# Patient Record
Sex: Female | Born: 1975 | Race: White | Hispanic: No | Marital: Married | Smoking: Never smoker
Health system: Southern US, Community
[De-identification: ages and names within clinical notes are randomized; demographics above are authoritative.]

## PROBLEM LIST (undated history)

## (undated) DIAGNOSIS — F329 Major depressive disorder, single episode, unspecified: Secondary | ICD-10-CM

## (undated) DIAGNOSIS — F502 Bulimia nervosa, unspecified: Secondary | ICD-10-CM

## (undated) DIAGNOSIS — R569 Unspecified convulsions: Secondary | ICD-10-CM

## (undated) DIAGNOSIS — F32A Depression, unspecified: Secondary | ICD-10-CM

## (undated) DIAGNOSIS — D649 Anemia, unspecified: Secondary | ICD-10-CM

## (undated) DIAGNOSIS — Z8541 Personal history of malignant neoplasm of cervix uteri: Secondary | ICD-10-CM

## (undated) DIAGNOSIS — B977 Papillomavirus as the cause of diseases classified elsewhere: Secondary | ICD-10-CM

## (undated) HISTORY — DX: Major depressive disorder, single episode, unspecified: F32.9

## (undated) HISTORY — PX: BREAST ENHANCEMENT SURGERY: SHX7

## (undated) HISTORY — DX: Bulimia nervosa, unspecified: F50.20

## (undated) HISTORY — DX: Depression, unspecified: F32.A

## (undated) HISTORY — DX: Bulimia nervosa: F50.2

## (undated) HISTORY — DX: Papillomavirus as the cause of diseases classified elsewhere: B97.7

## (undated) HISTORY — DX: Unspecified convulsions: R56.9

## (undated) HISTORY — DX: Personal history of malignant neoplasm of cervix uteri: Z85.41

## (undated) HISTORY — DX: Anemia, unspecified: D64.9

---

## 1999-08-22 HISTORY — PX: CERVICAL BIOPSY  W/ LOOP ELECTRODE EXCISION: SUR135

## 2009-11-23 ENCOUNTER — Encounter: Payer: Self-pay | Admitting: Family Medicine

## 2009-12-15 ENCOUNTER — Encounter: Payer: Self-pay | Admitting: Family Medicine

## 2010-08-04 ENCOUNTER — Ambulatory Visit: Payer: Self-pay | Admitting: Family Medicine

## 2010-08-04 DIAGNOSIS — F410 Panic disorder [episodic paroxysmal anxiety] without agoraphobia: Secondary | ICD-10-CM

## 2010-08-04 DIAGNOSIS — R569 Unspecified convulsions: Secondary | ICD-10-CM | POA: Insufficient documentation

## 2010-08-04 DIAGNOSIS — L989 Disorder of the skin and subcutaneous tissue, unspecified: Secondary | ICD-10-CM | POA: Insufficient documentation

## 2010-08-04 DIAGNOSIS — M722 Plantar fascial fibromatosis: Secondary | ICD-10-CM

## 2010-08-04 DIAGNOSIS — D649 Anemia, unspecified: Secondary | ICD-10-CM | POA: Insufficient documentation

## 2010-08-04 DIAGNOSIS — Z8541 Personal history of malignant neoplasm of cervix uteri: Secondary | ICD-10-CM | POA: Insufficient documentation

## 2010-08-04 DIAGNOSIS — F329 Major depressive disorder, single episode, unspecified: Secondary | ICD-10-CM

## 2010-09-22 NOTE — Letter (Signed)
Summary: Patient Questionnaire  Patient Questionnaire   Imported By: Beau Fanny 08/04/2010 15:18:11  _____________________________________________________________________  External Attachment:    Type:   Image     Comment:   External Document

## 2010-09-22 NOTE — Miscellaneous (Signed)
Summary: Vaccine Records  Vaccine Records   Imported By: Lanelle Bal 08/12/2010 11:33:10  _____________________________________________________________________  External Attachment:    Type:   Image     Comment:   External Document

## 2010-09-22 NOTE — Assessment & Plan Note (Signed)
Summary: NEW PT TO EST/CLE   Vital Signs:  Patient profile:   35 year old female Height:      67.5 inches Weight:      152.25 pounds BMI:     23.58 Temp:     98.6 degrees F oral Pulse rate:   68 / minute Pulse rhythm:   regular BP sitting:   108 / 76  (left arm) Cuff size:   regular  Vitals Entered By: Lewanda Rife LPN (August 04, 2010 11:26 AM) CC: New pt to establish   History of Present Illness: is starting PT school -- is excited about that  moved here from [place]   she does not see a gyn -- needs appt in may last few paps after the leep were nl   had bout of anxiety in the past  was on cymbalta for a while  she cut out caffine works out 6-7 days per week  occ has panic attack couple times per year -- usually last about an hour   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 is better now --- with counseling and psychiatry  no longer obcesses about her weight   has plantar fasciitis - used to wear a boot - to sleep is a runner   does stretches  one foot is worse  L is left  -- hurts lateral foot  too many flip flops   on R shoulder -- is since august -- sees Derm next week-- is firm mole - that scales up  random episodes of night sweats  could be peroid related   sleep requirements are more than her husb and that concerns her  she does a lot during the day and is generally high energy   last blood draw in a long time           Preventive Screening-Counseling & Management  Alcohol-Tobacco     Smoking Status: never  Caffeine-Diet-Exercise     Does Patient Exercise: yes      Drug Use:  no.    Allergies (verified): 1)  ! 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 Eyes:  Denies blurring and eye irritation.  CV:  Denies chest pain or discomfort, palpitations, and shortness of breath with exertion. Resp:  Denies cough, shortness of breath, and wheezing. GI:  Denies abdominal pain, change in bowel habits, indigestion, and nausea. GU:  Denies urinary frequency. MS:  Complains of joint pain, muscle aches, and stiffness; denies joint redness and joint swelling. Derm:  Denies itching, lesion(s), poor wound healing, and rash. Neuro:  Denies numbness and tingling. Psych:  Denies anxiety and depression. Endo:  Denies cold intolerance, excessive thirst, excessive urination, and heat intolerance. Heme:  Denies abnormal  bruising and bleeding.  Physical Exam  General:  Well-developed,well-nourished,in no acute distress; alert,appropriate and cooperative throughout examination Head:  normocephalic, atraumatic, and no abnormalities observed.   Eyes:  vision grossly intact, pupils equal, pupils round, and pupils reactive to light.   Ears:  R ear normal and L ear normal.   Nose:  no nasal discharge.   Mouth:  pharynx pink and moist.   Neck:  supple with full rom and no masses or thyromegally, no JVD or carotid bruit  Lungs:  Normal respiratory effort, chest expands symmetrically. Lungs are clear to auscultation, no crackles or wheezes. Heart:  Normal rate and regular rhythm. S1 and S2 normal without gallop, murmur, click, rub or other extra sounds. Msk:  some tenderness lateral L heel nl rom feel and knees no acute joint changes  Pulses:  R and L carotid,radial,femoral,dorsalis pedis and posterior tibial pulses are full and equal bilaterally Extremities:  no CCE Neurologic:  sensation intact to light touch, gait normal, and DTRs symmetrical and normal.   Skin:  scaley lesion L shoulder resembles SK Cervical Nodes:  No lymphadenopathy noted Psych:  normal affect, talkative and pleasant    Impression & Recommendations:  Problem # 1:  CERVICAL CANCER, HX OF (ICD-V10.41) Assessment New ref to gyn for ongoing care of abn paps in past  no symtoms at this time Orders: Gynecologic Referral (Gyn)  Problem # 2:  PLANTAR FASCIITIS (ICD-728.71) Assessment: New this is worse in 1 foot rev some home efforts to help-- no barefoot/ hard soled shoe/ ice  may need custom orthotics ultimately as a runner  will call back for appt with Dr Patsy Lager in future when she is ready  Problem # 3:  PANIC DISORDER (ICD-300.01) Assessment: New this is infrequent -- if worse consider ssri disc symptoms and stressors in detail  continue xanax sparingly prn Her updated medication list for this problem includes:    Xanax 0.5 Mg  Tabs (Alprazolam) .Marland Kitchen... 1 by mouth once daily as needed for panic attack or before a blood draw  Problem # 4:  SKIN LESION (ICD-709.9) Assessment: New resembles AK on R shoulder  will f/u with derm next week for tx -- likey cryotx or bx  Complete Medication List: 1)  Microgestin 1.5/30 1.5-30 Mg-mcg Tabs (Norethindrone acet-ethinyl est) .... Take 1 tablet by mouth once a day 2)  Zinc ?mg  .... Take 1 tablet by mouth once a day during winter months 3)  Multivitamins Tabs (Multiple vitamin) .... Take 1 tablet by mouth once a day 4)  Vitamin C 500 Mg Tabs (Ascorbic acid) .... Take 1 tablet by mouth once a day during winter months 5)  Echinacea ?mg  .... Take 1 tablet by mouth once a day during winter months 6)  Xanax 0.5 Mg Tabs (Alprazolam) .Marland Kitchen.. 1 by mouth once daily as needed for panic attack or before a blood draw  Patient Instructions: 1)  we will do gyn  referral at check out  2)  when you are ready to get plantar fasciitis checked out-- call to make appt with Dr Patsy Lager  3)  schedule fasting labs (take your xanax before) and then PE in may after your gyn visit  4)  follow up with derm about the mole  5)  use xanax with caution and only when absolutely necessary Prescriptions: XANAX 0.5 MG TABS (ALPRAZOLAM) 1 by mouth once daily as needed for panic attack or before a blood draw  #15 x 0   Entered and Authorized by:   Judith Part MD   Signed by:   Judith Part MD on 08/04/2010   Method used:   Print then Give to Patient   RxID:   [id number]    Orders Added: 1)  Gynecologic Referral [Gyn] 2)  New Patient Level III [99203]   Immunization History:  Tetanus/Td Immunization History:    Tetanus/Td:  historical (01/17/2006)  Influenza Immunization History:    Influenza:  historical received at [facility] (06/20/2010)   Immunization History:  Tetanus/Td Immunization History:    Tetanus/Td:  Historical (01/17/2006)  Influenza Immunization History:    Influenza:   Historical received at [facility] (06/20/2010)  Current Allergies (reviewed today): ! TUBERSOL (TUBERCULIN PPD)

## 2010-09-22 NOTE — Letter (Signed)
Summary: Copy of Medical History & Physical Exam Forms/[employer]  Copy of Medical History & Physical Exam Forms/[employer]   Imported By: Lanelle Bal 08/12/2010 11:34:20  _____________________________________________________________________  External Attachment:    Type:   Image     Comment:   External Document

## 2011-03-01 ENCOUNTER — Encounter: Payer: Self-pay | Admitting: Family Medicine

## 2011-03-01 ENCOUNTER — Ambulatory Visit (INDEPENDENT_AMBULATORY_CARE_PROVIDER_SITE_OTHER): Payer: BC Managed Care – PPO | Admitting: Family Medicine

## 2011-03-01 DIAGNOSIS — F419 Anxiety disorder, unspecified: Secondary | ICD-10-CM | POA: Insufficient documentation

## 2011-03-01 DIAGNOSIS — F411 Generalized anxiety disorder: Secondary | ICD-10-CM

## 2011-03-01 DIAGNOSIS — K120 Recurrent oral aphthae: Secondary | ICD-10-CM | POA: Insufficient documentation

## 2011-03-01 MED ORDER — SERTRALINE HCL 50 MG PO TABS: 50.0000 mg | ORAL_TABLET | Freq: Every day | ORAL | Status: DC

## 2011-03-01 NOTE — Assessment & Plan Note (Signed)
With situational stress and some panic attacks  Will try zoloft 50 mg - (start with 25) and update Disc poss side eff in detail incl dep or SI Disc lifestyle change for this and concentration  Disc stressors and exp for school  Urged to continue exercise >25 min spent with face to face with patient, >50% counseling and/or coordinating care

## 2011-03-01 NOTE — Patient Instructions (Signed)
Take 1/2 zoloft tab once daily in evening for 1 week and then increase to 1 pill each evening if well tolerated  If worse symptoms or intolerable side effects - call and let me know  Consider counseling in future if needed  Switch to baking soda and peroxide toothpaste

## 2011-03-01 NOTE — Assessment & Plan Note (Signed)
Intermittent- none today  Disc change toothpaste Stress plays a role Disc diet  If worse - consider kenalog in oragel for large lesions

## 2011-03-01 NOTE — Progress Notes (Signed)
Subjective:    Patient ID: Brenda Harrison, female    DOB: 1975-09-21, 35 y.o.   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  Had a few nights with excessive anxiety- up all night -- and then finally took a xanax  This was night before exams  More "near" panic attacks and old anx - with inc in stress Comes in waves - strongly  Given 15 xanax -- has taken 2 so far because she is worried about addiction   More trouble focusing also Moved to front of class Wears earplugs during exams  Trouble focusing during lectures   During stressful times gets sores in mouth with little ulcers  Has tried peroxide and salt water  They do get better  Also bites the inside of her mouth   School is more intense than she thought it would be -- 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 ANEMIA-NOS  . PANIC DISORDER  . DEPRESSION  . SKIN LESION  . PLANTAR FASCIITIS  . SEIZURE DISORDER  . CERVICAL CANCER, HX OF  . Anxiety  . Ulcer aphthous oral   Past Medical History  Diagnosis Date  . Anemia   . History of cervical cancer     hx of precancer cells  . Depression     temporary and mild  . Seizure     rxn to  TB skin test  . Bulimia     during her body building years  . HPV (human papilloma virus) infection    Past Surgical History  Procedure Date  . Breast enhancement surgery   . Cervical biopsy  w/ loop electrode excision 2001    to remove precancerous cells due to HPV 2001   History  Substance Use Topics  . Smoking status:  Never Smoker   . Smokeless tobacco: Not on file  . Alcohol Use: No   Family History  Problem Relation Age of Onset  . Hyperlipidemia Mother   . Hypertension Mother   . Anxiety disorder Mother   . Hyperlipidemia Father   . Hypertension Father   . Depression Father   . Colon cancer Maternal Grandmother   . Lung cancer Maternal Grandmother    Allergies  Allergen Reactions  . Tuberculin Purified Protein Derivative     REACTION: Grand mal seizures if takes TB skin test.   No current outpatient prescriptions on file prior to visit.       Review of Systems Review of Systems  Constitutional: Negative for fever, appetite change, fatigue and unexpected weight change.  Eyes: Negative for pain and visual disturbance.  Respiratory: Negative for cough and shortness of breath.   Cardiovascular: Negative.   Gastrointestinal: Negative for nausea, diarrhea and constipation.  Genitourinary: Negative for urgency and frequency.  Skin: Negative for pallor. neg for rash  Neurological: Negative for weakness, light-headedness, numbness and headaches.  Hematological: Negative for adenopathy. Does not bruise/bleed easily.  Psychiatric/Behavioral: Negative  for dysphoric mood. Pos for anx and trouble concentrating          Objective:   Physical Exam  Constitutional: She appears well-developed and well-nourished. No distress.  HENT:  Head: Normocephalic and atraumatic.  Nose: Nose normal.  Mouth/Throat: Oropharynx is clear and moist.       No mouth lesions today  Eyes: Conjunctivae and EOM are normal. Pupils are equal, round, and reactive to light.  Neck: Normal range of motion. Neck supple. No JVD present. No thyromegaly present.  Cardiovascular: Normal rate, regular rhythm and normal heart sounds.   Pulmonary/Chest: Effort normal and breath sounds normal. No respiratory distress. She has no wheezes. She has no rales.  Abdominal: Soft. Bowel sounds are normal. There is no tenderness.    Musculoskeletal: She exhibits no edema and no tenderness.  Lymphadenopathy:    She has no cervical adenopathy.  Neurological: She is alert. She has normal reflexes. No cranial nerve deficit. Coordination normal.       No tremor   Skin: Skin is warm and dry. No rash noted. No erythema. No pallor.  Psychiatric:       Mildly anxious  Speech is somewhat rapid Good eye contact and comm skills  Good insight Is attentive          Assessment & Plan:

## 2011-03-02 ENCOUNTER — Telehealth: Payer: Self-pay | Admitting: *Deleted

## 2011-03-02 MED ORDER — ESZOPICLONE 3 MG PO TABS: 3.0000 mg | ORAL_TABLET | Freq: Every evening | ORAL | Status: DC | PRN

## 2011-03-02 NOTE — Telephone Encounter (Signed)
Go ahead and try it in the am  If this does not help I will px lunesta  Let me know how it goes

## 2011-03-02 NOTE — Telephone Encounter (Signed)
Patient says that she woke up at 3:00 am and was never able to go back to sleep. She says that she knows it is the Zoloft because when she would take these types of meds in the past it would cause her to have trouble sleeping. She says that in the past her previous Dr. Eulah Citizen prescribe lunesta. She is asking if you could call this in for her or if you would rather her stop taking the zoloft. Please advise. Uses [facility] on [address]

## 2011-03-02 NOTE — Telephone Encounter (Signed)
Patient called back and spoke with Jacki Cones. She asked if it is okay that she take the zoloft in the am. She was advised that it would be okay.

## 2011-03-02 NOTE — Telephone Encounter (Signed)
That is fine 

## 2011-03-02 NOTE — Telephone Encounter (Signed)
Patient notified as instructed by telephone. Pt said she is stressing so much about not sleeping and she has tried the OTC sleep aids and they are not helping. Pt said she thinks she needs to get prescription sleep aid and to verify if you want her to continue the Zoloft. Pt uses [facility] on [address]. [employer] if pharmacy needed.

## 2011-03-02 NOTE — Telephone Encounter (Signed)
Ok --Px written for call in  For [place]

## 2011-03-02 NOTE — Telephone Encounter (Signed)
Now prior Brenda Harrison is needed for lunesta, form from [employer] is on your shelf.

## 2011-03-02 NOTE — Telephone Encounter (Signed)
Patient notified as instructed by telephone. Medication phoned to [facility] as instructed. Pt said since she spoke with me she had called back and spoke with Jacki Cones. Pt is going to try taking Zoloft 1/4 pill daily for 1 wk, then 1/2pill daily for 1 wk, then 3/4 pill daily for one week and then work up to 1 pill daily of Zoloft. Pt just wanted Dr Milinda Antis to be aware. Thank you.

## 2011-03-03 NOTE — Telephone Encounter (Signed)
Completed form faxed to [phone] as instructed. Patient notified as instructed by telephone. Weston Brass at [facility] said pt could purchase 3 pills until hears from insurance co on prior auth. Copy of form given to [facility] and sent to be scanned.

## 2011-03-03 NOTE — Telephone Encounter (Signed)
Patient notified as instructed by telephone. Pt said the only med she has taken for sleep is Lunesta. Pt said she would call today to get preferred list and call back with info.

## 2011-03-03 NOTE — Telephone Encounter (Signed)
Prior auth given for [employer], pt and pharmacy advised.  Approval letter given to doctor for signature and scanning.

## 2011-03-03 NOTE — Telephone Encounter (Signed)
Pt states her insurance company told her that Alfonso Patten will be covered at a higher tier if we send the prior auth form back in.  She would like this sent back in today if possible.

## 2011-03-03 NOTE — Telephone Encounter (Signed)
Ok- form is in IN box - and I noted on it she wants to stay on lunesta  thanks

## 2011-03-03 NOTE — Telephone Encounter (Signed)
Pt called back again, said the problem is a quantity limit of 20 in a 30 day period.  I told her the form will say that she needs to take this every night.

## 2011-03-03 NOTE — Telephone Encounter (Signed)
Her prior Berkley Harvey does not give me a list of preferred drugs for sleep- just what it does not cover She will need to call her ins co to see what pref drugs are and then tell me which of those she has tried and failed Otherwise will need to pay out of pocket for lunesta Let me know what she wants to do and I will hold the papers on my desk-thanks

## 2011-03-06 ENCOUNTER — Ambulatory Visit: Payer: Self-pay | Admitting: Family Medicine

## 2011-05-03 ENCOUNTER — Encounter: Payer: Self-pay | Admitting: Family Medicine

## 2011-05-03 ENCOUNTER — Ambulatory Visit (INDEPENDENT_AMBULATORY_CARE_PROVIDER_SITE_OTHER): Payer: BC Managed Care – PPO | Admitting: Family Medicine

## 2011-05-03 VITALS — BP 124/76 | HR 60 | Temp 99.1°F | Ht 67.5 in | Wt 153.0 lb

## 2011-05-03 DIAGNOSIS — F419 Anxiety disorder, unspecified: Secondary | ICD-10-CM

## 2011-05-03 DIAGNOSIS — Z23 Encounter for immunization: Secondary | ICD-10-CM

## 2011-05-03 DIAGNOSIS — F411 Generalized anxiety disorder: Secondary | ICD-10-CM

## 2011-05-03 DIAGNOSIS — Z0000 Encounter for general adult medical examination without abnormal findings: Secondary | ICD-10-CM | POA: Insufficient documentation

## 2011-05-03 DIAGNOSIS — Z8541 Personal history of malignant neoplasm of cervix uteri: Secondary | ICD-10-CM

## 2011-05-03 DIAGNOSIS — Z111 Encounter for screening for respiratory tuberculosis: Secondary | ICD-10-CM | POA: Insufficient documentation

## 2011-05-03 NOTE — Assessment & Plan Note (Addendum)
Reviewed health habits including diet and exercise and skin cancer prevention Also reviewed health mt list, fam hx and immunizations   Will schedule wellness labs when she can return for them  She may also need a drug screen done for school  Flu shot and Tdap today

## 2011-05-03 NOTE — Patient Instructions (Signed)
Schedule upcoming labs (not fasting but eat light)  And chest x ray when you check out  Tdap vaccine and flu shot today  Ask Dr Brenda Harrison about your night sweats and lack or menses and baseline mammogram  I will update you about results when they return

## 2011-05-03 NOTE — Assessment & Plan Note (Signed)
Doing very well with zoloft and rare xanax  Will continue these  Did cut zoloft to 25 mg Enc continuing exercise

## 2011-05-03 NOTE — Assessment & Plan Note (Signed)
utd on pap with hpv - was neg in July  Enc pt to disc nt sweats and amenorrhea with Dr Vincente Poli

## 2011-05-03 NOTE — Assessment & Plan Note (Signed)
Pt allergic to PPD test so will need cxr for school when Camelia Eng is back No symptoms  Will schedule that

## 2011-05-03 NOTE — Progress Notes (Signed)
Subjective:    Patient ID: Brenda Harrison, female    DOB: 27-Jan-1976, 35 y.o.   MRN: [id number]  HPI Here for annual wellness exam and to review chronic med problems  Is hanging in there  School is very intense and going fast  Is doing ok overall - so far so good  Still has 2 years left for school  Cannot do labs - did not take her xanax today (needs that for anx of blood draw)  Wants to get a flu shot  Cannot get a PPD due to allergy  So needs that and then a letter on letter head    Feeling ok overall  Some sneezing - could be allergies  Clear mucous  hayfever is starting - 1-2 weeks   Wt is stable with bmi of 23  Pap - by gyn-- with nl pap smear  Hx of cervical pre cancerous cells and HPV On microgestin peroids are seldom on this pill - some spotting  Still has night sweats  ? Early menopause -- takes ca and vit D and wt lifting   Td 07- is interested in Tdap for pertussis protection     Anxiety / depression  Is doing well - taking 1/2 of the 50 mg  Likes it - no lack of motivation and anxiety is well controlled   No new family history  May need a drug test for work but does not have the tests with her or know if she can do them here   Patient Active Problem List  Diagnoses  . ANEMIA-NOS  . PANIC DISORDER  . DEPRESSION  . SKIN LESION  . PLANTAR FASCIITIS  . SEIZURE DISORDER  . CERVICAL CANCER, HX OF  . Anxiety  . Ulcer aphthous oral  . Routine general medical examination at a health care facility  . Screening-pulmonary TB   Past Medical History  Diagnosis Date  . Anemia   . History of cervical cancer     hx of precancer cells  . Depression     temporary and mild  . Seizure     rxn to  TB skin test  . Bulimia     during her body building years  . HPV (human papilloma virus) infection    Past Surgical History  Procedure Date  . Breast enhancement surgery   . Cervical biopsy  w/ loop electrode excision 2001    to remove precancerous cells due  to HPV 2001   History  Substance Use Topics  . Smoking status: Never Smoker   . Smokeless tobacco: Not on file  . Alcohol Use: No   Family History  Problem Relation Age of Onset  . Hyperlipidemia Mother   . Hypertension Mother   . Anxiety disorder Mother   . Hyperlipidemia Father   . Hypertension Father   . Depression Father   . Colon cancer Maternal Grandmother   . Lung cancer Maternal Grandmother    Allergies  Allergen Reactions  . Tuberculin Purified Protein Derivative     REACTION: Grand mal seizures if takes TB skin test.   Current Outpatient Prescriptions on File Prior to Visit  Medication Sig Dispense Refill  . ALPRAZolam (XANAX) 0.5 MG tablet Take 0.5 mg by mouth. Once daily or as needed for panic attacks or before blood draw      . calcium carbonate 200 MG capsule Take by mouth daily.        Marland Kitchen ECHINACEA HERB PO Take 1 tablet by mouth daily.  During the winter       . Ginkgo Biloba 40 MG TABS Take 1 tablet by mouth daily.        . Multiple Vitamin (MULTIVITAMIN) tablet Take 1 tablet by mouth daily.        . Multiple Vitamins-Minerals (ZINC PO) Take 1 tablet by mouth daily.        . norethindrone-ethinyl estradiol-iron (MICROGESTIN FE1.5/30) 1.5-30 MG-MCG tablet Take 1 tablet by mouth daily.        . sertraline (ZOLOFT) 50 MG tablet Take 1 tablet (50 mg total) by mouth daily.  30 tablet  11  . vitamin B-12 (CYANOCOBALAMIN) 50 MCG tablet Take 50 mcg by mouth daily.        . vitamin C (ASCORBIC ACID) 500 MG tablet Take 500 mg by mouth daily. During winter       . Eszopiclone (ESZOPICLONE) 3 MG TABS Take 1 tablet (3 mg total) by mouth at bedtime as needed. Take immediately before bedtime  30 tablet  3      Review of Systems Review of Systems  Constitutional: Negative for fever, appetite change, fatigue and unexpected weight change.  Eyes: Negative for pain and visual disturbance.  Respiratory: Negative for cough and shortness of breath.   Cardiovascular: Negative for  cp or palpitations    Gastrointestinal: Negative for nausea, diarrhea and constipation.  Genitourinary: Negative for urgency and frequency.  Skin: Negative for pallor or rash   Neurological: Negative for weakness, light-headedness, numbness and headaches.  Hematological: Negative for adenopathy. Does not bruise/bleed easily.  Psychiatric/Behavioral: Negative for dysphoric mood. Anxiety is improved         Objective:   Physical Exam  Constitutional: She appears well-developed and well-nourished. No distress.  HENT:  Head: Normocephalic and atraumatic.  Right Ear: External ear normal.  Left Ear: External ear normal.  Nose: Nose normal.  Mouth/Throat: Oropharynx is clear and moist.  Eyes: Conjunctivae and EOM are normal. Pupils are equal, round, and reactive to light.  Neck: Normal range of motion. Neck supple. No JVD present. Carotid bruit is not present. No thyromegaly present.  Cardiovascular: Normal rate, regular rhythm, normal heart sounds and intact distal pulses.   Pulmonary/Chest: Effort normal and breath sounds normal. No respiratory distress. She has no wheezes.  Abdominal: Soft. Bowel sounds are normal. She exhibits no distension and no mass. There is no tenderness.  Musculoskeletal: Normal range of motion. She exhibits no edema and no tenderness.  Lymphadenopathy:    She has no cervical adenopathy.  Neurological: She is alert. She has normal reflexes. No cranial nerve deficit. Coordination normal.  Skin: Skin is warm and dry. No rash noted. No erythema. No pallor.  Psychiatric: She has a normal mood and affect.       Cheerful and talkative           Assessment & Plan:

## 2011-05-15 ENCOUNTER — Telehealth: Payer: Self-pay | Admitting: *Deleted

## 2011-05-15 NOTE — Telephone Encounter (Signed)
Pt needs a letter stating that she has been prescribed xanax.  She needs this for a drug screen that she will be having, needs on office letter head.  Please call when ready.

## 2011-05-16 ENCOUNTER — Telehealth: Payer: Self-pay

## 2011-05-16 DIAGNOSIS — Z111 Encounter for screening for respiratory tuberculosis: Secondary | ICD-10-CM

## 2011-05-16 DIAGNOSIS — Z021 Encounter for pre-employment examination: Secondary | ICD-10-CM | POA: Insufficient documentation

## 2011-05-16 NOTE — Telephone Encounter (Signed)
I will write letter and put it in IN box.

## 2011-05-16 NOTE — Telephone Encounter (Signed)
Pt needs written lab order for 2 point urinalysis drug test. Pt also needs letter stating she is allergic to PPD and that is why she has a chest x ray and also include in the letter that pt is prescribed Xanax which can be found in the drug screening test. Pt will pick up order and letter when has lab and chest xray done here at [facility] lab on 05/17/11.

## 2011-05-16 NOTE — Telephone Encounter (Signed)
Patient notified as instructed by telephone v/m as instructed by pt.Letter is at front desk for pick up.

## 2011-05-16 NOTE — Telephone Encounter (Signed)
Sorry for the confusion. Rodney Booze said we do not do drug screens at the lab here so that is why pt needs written order for the 12 point urinalysis drug test ( unable to reach pt by phone but Rodney Booze said pt could get at one of the [facility] stations). The chest x ray does need to be ordered here but pt said she needs a letter stating the reason she is getting a chest x ray is because she is allergic to tb skin test.(not sure if this is for the school or pt's insurance.)

## 2011-05-16 NOTE — Telephone Encounter (Signed)
Could you clarify-  She needs order on px pad for the lab (we are not doing here) - and ask where she is having it  I need to order the cxr here- correct? Thanks - then I will proceed

## 2011-05-16 NOTE — Telephone Encounter (Signed)
I will put px in the IN box According to the chart I have already ordered the cxr -- just need to schedule it please  I will do letter and put that in the IN box too

## 2011-05-16 NOTE — Telephone Encounter (Signed)
Patient notified as instructed by telephone v/m as instructed by pt.Pt said she already has appt for lab and xray 05/17/11.Letter and lab order is at front desk for pick up.

## 2011-05-17 ENCOUNTER — Other Ambulatory Visit (INDEPENDENT_AMBULATORY_CARE_PROVIDER_SITE_OTHER): Payer: BC Managed Care – PPO

## 2011-05-17 DIAGNOSIS — Z0000 Encounter for general adult medical examination without abnormal findings: Secondary | ICD-10-CM

## 2011-05-17 LAB — COMPREHENSIVE METABOLIC PANEL
AST: 18 U/L (ref 0–37)
Albumin: 3.5 g/dL (ref 3.5–5.2)
BUN: 10 mg/dL (ref 6–23)
CO2: 25 mEq/L (ref 19–32)
Calcium: 8.9 mg/dL (ref 8.4–10.5)
Chloride: 104 mEq/L (ref 96–112)
Creatinine, Ser: 0.8 mg/dL (ref 0.4–1.2)
GFR: 91.57 mL/min (ref >60.00)
Glucose, Bld: 129 mg/dL — ABNORMAL HIGH (ref 70–99)
Potassium: 4.4 mEq/L (ref 3.5–5.1)

## 2011-05-17 LAB — CBC WITH DIFFERENTIAL/PLATELET
Basophils Relative: 0.4 % (ref 0.0–3.0)
Eosinophils Absolute: 0.2 10*3/uL (ref 0.0–0.7)
Eosinophils Relative: 2.9 % (ref 0.0–5.0)
Hemoglobin: 13.8 g/dL (ref 12.0–15.0)
Lymphocytes Relative: 35.8 % (ref 12.0–46.0)
MCHC: 33.6 g/dL (ref 30.0–36.0)
Monocytes Relative: 6.7 % (ref 3.0–12.0)
Neutro Abs: 3.2 10*3/uL (ref 1.4–7.7)
Neutrophils Relative %: 54.2 % (ref 43.0–77.0)
RBC: 4.2 Mil/uL (ref 3.87–5.11)
WBC: 5.9 10*3/uL (ref 4.5–10.5)

## 2011-05-17 LAB — LIPID PANEL
Cholesterol: 186 mg/dL (ref 0–200)
HDL: 55.9 mg/dL (ref >39.00)
Total CHOL/HDL Ratio: 3
Triglycerides: 38 mg/dL (ref 0.0–149.0)

## 2011-05-24 ENCOUNTER — Ambulatory Visit (INDEPENDENT_AMBULATORY_CARE_PROVIDER_SITE_OTHER)
Admission: RE | Admit: 2011-05-24 | Discharge: 2011-05-24 | Disposition: A | Discharge status: Home / Self Care | Payer: BC Managed Care – PPO | Source: Ambulatory Visit | Attending: Family Medicine | Admitting: Family Medicine

## 2011-05-24 DIAGNOSIS — Z111 Encounter for screening for respiratory tuberculosis: Secondary | ICD-10-CM

## 2011-05-26 ENCOUNTER — Telehealth: Payer: Self-pay | Admitting: *Deleted

## 2011-05-26 NOTE — Telephone Encounter (Signed)
I will work on it to have it done then

## 2011-05-26 NOTE — Telephone Encounter (Signed)
Pt states she dropped off a physical form for completion and she is asking if ready for pick up.  She's coming in on Tuesday to pick up a copy of her CXR report and wants to pick the form up then.

## 2011-05-26 NOTE — Telephone Encounter (Signed)
Patient notified as instructed by telephone. Pt will come by Fri 06/02/11 for vision screening test and will pick up form at that time.

## 2011-05-26 NOTE — Telephone Encounter (Signed)
Pt got off early today and came by and picked up xray report and asked that physical exam report be mailed to her home when Dr Milinda Antis completes it.

## 2011-06-06 NOTE — Telephone Encounter (Signed)
Pt came by [place] and someone did vision test (I was not in office) and copy of form sent to be scanned and pt got original form.

## 2011-07-21 ENCOUNTER — Other Ambulatory Visit: Payer: Self-pay | Admitting: Family Medicine

## 2011-07-21 NOTE — Telephone Encounter (Signed)
[facility] request refill alprazolam 0.5 mg . Last refilled 08/26/10 and pt last seen 05/03/11.Please advise.

## 2011-07-23 NOTE — Telephone Encounter (Signed)
Px written for call in   

## 2011-07-24 ENCOUNTER — Other Ambulatory Visit: Payer: Self-pay | Admitting: Family Medicine

## 2011-07-24 NOTE — Telephone Encounter (Signed)
Medication phoned to [facility] pharmacy as instructed.

## 2011-07-25 NOTE — Telephone Encounter (Signed)
?   Just did on 11/30?

## 2011-09-06 ENCOUNTER — Telehealth: Payer: Self-pay | Admitting: Internal Medicine

## 2011-09-06 DIAGNOSIS — F909 Attention-deficit hyperactivity disorder, unspecified type: Secondary | ICD-10-CM | POA: Insufficient documentation

## 2011-09-06 MED ORDER — AMPHETAMINE-DEXTROAMPHETAMINE 20 MG PO TABS: ORAL_TABLET | ORAL | Status: DC

## 2011-09-06 NOTE — Telephone Encounter (Signed)
Patient faxed notes from her psychologist dx. With ADHD and was hoping to get medication for this.  Please advise.

## 2011-09-06 NOTE — Telephone Encounter (Signed)
Patient notified as instructed by telephone.Prescription left at front desk. Appt scheduled with Dr Milinda Antis 10/17/11 at 11:15am.

## 2011-09-06 NOTE — Telephone Encounter (Signed)
I reviewed those records and am in agreement that she needs to get started on a stimulant- my first choice is adderall 20 bid  Someone has to pick px up for her - cannot be mailed from here - I know she is out of town on rotation and cannot come in until feb If side eff-nervousness/ dec appetite/ headache/ stomach ache- stop it and call  It will be a process px and adj med to suit her  Let her know how it works here in terms of monthly written px and pick up Px printed for pick up in IN box F/u in feb when she gets back please

## 2011-10-03 ENCOUNTER — Other Ambulatory Visit: Payer: Self-pay

## 2011-10-03 NOTE — Telephone Encounter (Signed)
Pt called requested written rx for adderall. Please call pt at [phone] when rx is ready.

## 2011-10-04 ENCOUNTER — Telehealth: Payer: Self-pay | Admitting: Family Medicine

## 2011-10-04 MED ORDER — AMPHETAMINE-DEXTROAMPHETAMINE 20 MG PO TABS: ORAL_TABLET | ORAL | Status: DC

## 2011-10-04 NOTE — Telephone Encounter (Signed)
Left message on cell phone voicemail, Rx is ready for pick up will be left at front desk. 

## 2011-10-04 NOTE — Telephone Encounter (Signed)
Pt request Adderall 20mg ... Call Back # [phone]

## 2011-10-04 NOTE — Telephone Encounter (Signed)
Px printed for pick up in IN box  

## 2011-10-04 NOTE — Telephone Encounter (Signed)
I did this already.

## 2011-10-17 ENCOUNTER — Ambulatory Visit (INDEPENDENT_AMBULATORY_CARE_PROVIDER_SITE_OTHER): Payer: BC Managed Care – PPO | Admitting: Family Medicine

## 2011-10-17 ENCOUNTER — Encounter: Payer: Self-pay | Admitting: Family Medicine

## 2011-10-17 VITALS — BP 114/70 | HR 72 | Temp 98.0°F | Ht 67.5 in | Wt 148.0 lb

## 2011-10-17 DIAGNOSIS — R7309 Other abnormal glucose: Secondary | ICD-10-CM

## 2011-10-17 DIAGNOSIS — F909 Attention-deficit hyperactivity disorder, unspecified type: Secondary | ICD-10-CM

## 2011-10-17 DIAGNOSIS — F329 Major depressive disorder, single episode, unspecified: Secondary | ICD-10-CM

## 2011-10-17 DIAGNOSIS — R739 Hyperglycemia, unspecified: Secondary | ICD-10-CM | POA: Insufficient documentation

## 2011-10-17 NOTE — Assessment & Plan Note (Signed)
Doing very well with current adderall - no problems or side eff and doing well in school Will continue this dose without change

## 2011-10-17 NOTE — Assessment & Plan Note (Signed)
Sugar of 129 was in fact PP - not fasting as originally thought Will f/u with a1c when pt can return for it - do not expect it to be high Disc healthy diet with enough protein

## 2011-10-17 NOTE — Assessment & Plan Note (Signed)
Doing very well with current dose of zoloft Using xanax infrequently for tests or when she has blood drawn  Will continue to monitor

## 2011-10-17 NOTE — Patient Instructions (Signed)
Schedule non fasting lab for a1c when able  Keep up healthy diet - make sure you are getting enough calories for exercise  I'm glad you are doing well  No change in medicines

## 2011-10-17 NOTE — Progress Notes (Signed)
Subjective:    Patient ID: Brenda Harrison, female    DOB: 03-22-1976, 36 y.o.   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  Is inspiring to her   bp 114/70- good Wt is down 5 lb with bmi 22 Diet is good - lost wt from not lifting wts for a while- lost muscle (still runs)  Does have hx of bulemia-- no longer a problem   On zoloft/xanax for anxiety and depression Mood is still very stable  Has not needed xanax at all - had to take just for exams in the past  Gets extended time for exams   Also has ADD- doing very well so far - the medicine is helping quite a bit with attentiveness  Does try to minimize distraction  Last visit - had 129 sugar- had eaten honey 15 min before  Will do a1c when she wants to -- has to take her xanax for blood draw No excessive thirst or urination No blurred vision  Patient Active Problem List  Diagnoses  . ANEMIA-NOS  . PANIC DISORDER  . DEPRESSION  . SKIN LESION  . PLANTAR FASCIITIS  . SEIZURE DISORDER  . CERVICAL CANCER, HX OF  . Anxiety  . Ulcer aphthous oral  . Routine general medical examination at a health care facility  . Screening-pulmonary TB  . Drug screening, pre-employment  . ADHD (attention deficit hyperactivity disorder)  . Hyperglycemia   Past Medical History  Diagnosis Date  . Anemia   . History of cervical cancer     hx of precancer cells  . Depression     temporary and mild  . Seizure     rxn to  TB skin test  . Bulimia     during her body building years  . HPV (human papilloma virus) infection    Past Surgical History  Procedure Date  . Breast enhancement surgery   . Cervical biopsy  w/ loop electrode excision 2001    to remove precancerous cells due to HPV 2001   History  Substance Use Topics  . Smoking status: Never Smoker   . Smokeless tobacco: Not on file  . Alcohol Use: No   Family History    Problem Relation Age of Onset  . Hyperlipidemia Mother   . Hypertension Mother   . Anxiety disorder Mother   . Hyperlipidemia Father   . Hypertension Father   . Depression Father   . Colon cancer Maternal Grandmother   . Lung cancer Maternal Grandmother    Allergies  Allergen Reactions  . Tuberculin Purified Protein Derivative     REACTION: Grand mal seizures if takes TB skin test.   Current Outpatient Prescriptions on File Prior to Visit  Medication Sig Dispense Refill  . ALPRAZolam (XANAX) 0.5 MG tablet TAKE 1 TABLET BY MOUTH EVERY DAY AS NEEDED FOR PANIC ATTACK OR BEFORE A BLOOD DRAW  15 tablet  0  . amphetamine-dextroamphetamine (ADDERALL, 20MG ,) 20 MG tablet Take one by mouth in am and one at lunch time  60 tablet  0  . calcium carbonate 200 MG capsule Take by mouth daily.        Marland Kitchen ECHINACEA HERB PO Take 1 tablet by mouth daily. During the winter       . Eszopiclone (ESZOPICLONE) 3 MG TABS Take 1 tablet (3 mg total) by mouth at bedtime as needed.  Take immediately before bedtime  30 tablet  3  . Ginkgo Biloba 40 MG TABS Take 1 tablet by mouth daily.        . Glucosamine 500 MG TABS Take 1 tablet by mouth daily.        Marland Kitchen LYSINE PO Take 1 tablet by mouth daily.        . Multiple Vitamin (MULTIVITAMIN) tablet Take 1 tablet by mouth daily.        . Multiple Vitamins-Minerals (ZINC PO) Take 1 tablet by mouth daily.        . norethindrone-ethinyl estradiol-iron (MICROGESTIN FE1.5/30) 1.5-30 MG-MCG tablet Take 1 tablet by mouth daily.        . Omega-3 Fatty Acids (FISH OIL) 1000 MG CAPS Take 1 capsule by mouth daily.        . sertraline (ZOLOFT) 50 MG tablet Take 1 tablet (50 mg total) by mouth daily.  30 tablet  11  . vitamin B-12 (CYANOCOBALAMIN) 50 MCG tablet Take 50 mcg by mouth daily.        . vitamin C (ASCORBIC ACID) 500 MG tablet Take 500 mg by mouth daily. During winter          Review of Systems Review of Systems  Constitutional: Negative for fever, appetite change,  fatigue and unexpected weight change.  Eyes: Negative for pain and visual disturbance.  Respiratory: Negative for cough and shortness of breath.   Cardiovascular: Negative for cp or palpitations    Gastrointestinal: Negative for nausea, diarrhea and constipation.  Genitourinary: Negative for urgency and frequency.  Skin: Negative for pallor or rash   Neurological: Negative for weakness, light-headedness, numbness and headaches.  Hematological: Negative for adenopathy. Does not bruise/bleed easily.  Psychiatric/Behavioral: anx and depression in good control , no SI , improved concentration       Objective:   Physical Exam  Constitutional: She appears well-developed and well-nourished. No distress.  HENT:  Head: Normocephalic and atraumatic.  Mouth/Throat: Oropharynx is clear and moist.  Eyes: Conjunctivae and EOM are normal. Pupils are equal, round, and reactive to light. No scleral icterus.  Neck: Normal range of motion. Neck supple. No JVD present. Carotid bruit is not present. No thyromegaly present.  Cardiovascular: Normal rate, regular rhythm, normal heart sounds and intact distal pulses.  Exam reveals no gallop.   Pulmonary/Chest: Effort normal and breath sounds normal. No respiratory distress. She has no wheezes.  Abdominal: Soft. Bowel sounds are normal. She exhibits no abdominal bruit and no mass.  Musculoskeletal: She exhibits no edema and no tenderness.  Lymphadenopathy:    She has no cervical adenopathy.  Neurological: She has normal reflexes. She displays no tremor. No cranial nerve deficit. She exhibits normal muscle tone. Coordination normal.  Skin: Skin is warm and dry. No rash noted. No pallor.  Psychiatric: She has a normal mood and affect. Her behavior is normal. Thought content normal.       Attentive Good eye contact  Good communication skills          Assessment & Plan:

## 2011-11-01 ENCOUNTER — Other Ambulatory Visit: Payer: Self-pay | Admitting: *Deleted

## 2011-11-01 NOTE — Telephone Encounter (Signed)
Patient called requesting a refill on her Adderall. Patient states that she would like to pick it up Friday. Please call and let her know when it is ready.

## 2011-11-03 MED ORDER — AMPHETAMINE-DEXTROAMPHETAMINE 20 MG PO TABS: ORAL_TABLET | ORAL | Status: DC

## 2011-11-03 NOTE — Telephone Encounter (Signed)
Patient notified as instructed by telephone by Carrie. Prescription left at front desk.  

## 2011-11-03 NOTE — Telephone Encounter (Signed)
Px printed for pick up in IN box  

## 2011-11-30 ENCOUNTER — Other Ambulatory Visit: Payer: Self-pay

## 2011-11-30 NOTE — Telephone Encounter (Signed)
Pt request written rx Adderall 20 mg. Pt last seen 10/17/11. Pt would like to pick up written rx on 12/01/11 and pt can be reached at [phone].

## 2011-12-01 MED ORDER — AMPHETAMINE-DEXTROAMPHETAMINE 20 MG PO TABS: ORAL_TABLET | ORAL | Status: DC

## 2011-12-01 NOTE — Telephone Encounter (Signed)
Px printed for pick up in IN box  

## 2011-12-04 NOTE — Telephone Encounter (Signed)
Left message on cell phone voicemail advising patient that Rx is ready for pick up will be left at front desk. 

## 2011-12-27 ENCOUNTER — Other Ambulatory Visit: Payer: Self-pay | Admitting: Family Medicine

## 2011-12-27 MED ORDER — AMPHETAMINE-DEXTROAMPHETAMINE 20 MG PO TABS: ORAL_TABLET | ORAL | Status: DC

## 2011-12-27 NOTE — Telephone Encounter (Signed)
Px printed for pick up in IN box  

## 2011-12-27 NOTE — Telephone Encounter (Signed)
Request for Adderall refill.  She would like to pick it up no later than Monday.  Please leave it at the front desk and call her when its ready.  Thanks

## 2011-12-28 NOTE — Telephone Encounter (Signed)
Message left advising patient that Rx was ready. Placed up front for pick up. 

## 2012-01-23 ENCOUNTER — Other Ambulatory Visit: Payer: Self-pay | Admitting: Family Medicine

## 2012-01-23 NOTE — Telephone Encounter (Signed)
I cannot legally post date refils but I can write her 3 px... One to fill now , one with directions not to fill for 1 month and one with directions not to fill for 2 months Ask her if that is ok and I will write them

## 2012-01-23 NOTE — Telephone Encounter (Signed)
Patient needs a refill on Rx for Adderall. 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

## 2012-01-24 MED ORDER — AMPHETAMINE-DEXTROAMPHETAMINE 20 MG PO TABS: ORAL_TABLET | ORAL | Status: DC

## 2012-01-24 NOTE — Telephone Encounter (Signed)
Patient informed Rx scripts [3-mths supply] ready for p/u Mon-Fri 8a-5p/SLS

## 2012-01-24 NOTE — Telephone Encounter (Signed)
Left message on machine to call back  

## 2012-01-24 NOTE — Telephone Encounter (Signed)
Patient clarified & understood that we can give Rx with posted future fill date, but not post dated/SLS Informed patient will call back to inform when Rx[s] ready for p/u; printed awaiting MD signature.

## 2012-02-28 ENCOUNTER — Other Ambulatory Visit: Payer: Self-pay

## 2012-02-28 MED ORDER — NORETHIN ACE-ETH ESTRAD-FE 1.5-30 MG-MCG PO TABS: 1.0000 | ORAL_TABLET | Freq: Every day | ORAL | Status: DC

## 2012-02-28 NOTE — Telephone Encounter (Signed)
Pt left v/m requesting refill Microgestin to [facility] in [place], [place]. Pt said had spoken with Dr Milinda Antis about refilling Microgestin instead of GYN since Dr Milinda Antis will do CPX 07/03/12.Please advise.

## 2012-02-28 NOTE — Telephone Encounter (Signed)
Will refill electronically  

## 2012-03-07 ENCOUNTER — Other Ambulatory Visit: Payer: Self-pay | Admitting: Family Medicine

## 2012-03-08 ENCOUNTER — Other Ambulatory Visit: Payer: Self-pay

## 2012-03-08 MED ORDER — ESZOPICLONE 3 MG PO TABS: 3.0000 mg | ORAL_TABLET | Freq: Every evening | ORAL | Status: AC | PRN

## 2012-03-08 NOTE — Telephone Encounter (Signed)
Px written for call in   

## 2012-03-08 NOTE — Telephone Encounter (Signed)
Ok to refill? Last OV was 10/17/11

## 2012-03-08 NOTE — Telephone Encounter (Signed)
Called in Rx as directed.  

## 2012-04-08 ENCOUNTER — Other Ambulatory Visit: Payer: Self-pay | Admitting: Family Medicine

## 2012-04-08 NOTE — Telephone Encounter (Signed)
Will refill electronically  

## 2012-04-08 NOTE — Telephone Encounter (Signed)
Request for Setraline 50 mg last OV 10/17/11. Ok to refill?

## 2012-04-24 ENCOUNTER — Other Ambulatory Visit: Payer: Self-pay

## 2012-04-24 NOTE — Telephone Encounter (Signed)
Left message on vm requesting call back on clarification if Rx is a mail order.

## 2012-04-24 NOTE — Telephone Encounter (Signed)
Pt left v/m requesting 3 months rx for adderall. Call when ready for pick up.

## 2012-04-24 NOTE — Telephone Encounter (Signed)
Is this for mail order? - I need to know before I px  thanks

## 2012-04-25 NOTE — Telephone Encounter (Signed)
See response below. About patient refill for Adderall.

## 2012-04-25 NOTE — Telephone Encounter (Signed)
Pt wants to pick up 3 written prescriptions on 04/26/12 for Adderall.

## 2012-04-26 MED ORDER — AMPHETAMINE-DEXTROAMPHETAMINE 20 MG PO TABS: ORAL_TABLET | ORAL | Status: DC

## 2012-04-26 NOTE — Telephone Encounter (Signed)
Left message on patient VM letting her know Rx was ready for pickup

## 2012-06-19 ENCOUNTER — Telehealth: Payer: Self-pay

## 2012-06-19 MED ORDER — FLUCONAZOLE 150 MG PO TABS: 150.0000 mg | ORAL_TABLET | Freq: Once | ORAL | Status: DC

## 2012-06-19 NOTE — Telephone Encounter (Signed)
Pt notified Rx was sent.

## 2012-06-19 NOTE — Telephone Encounter (Signed)
sent 

## 2012-06-19 NOTE — Telephone Encounter (Signed)
Pt had oral surgery this week; is on antibiotic. Today started with vaginal and perineal itching with slight white vaginal discharge. Pt request # 2 Diflucan sent to [facility].Please advise.

## 2012-06-25 ENCOUNTER — Telehealth: Payer: Self-pay | Admitting: Family Medicine

## 2012-06-25 DIAGNOSIS — R739 Hyperglycemia, unspecified: Secondary | ICD-10-CM

## 2012-06-25 DIAGNOSIS — Z0000 Encounter for general adult medical examination without abnormal findings: Secondary | ICD-10-CM

## 2012-06-25 NOTE — Telephone Encounter (Signed)
Message copied by Judy Pimple on Tue Jun 25, 2012  5:44 PM ------      Message from: Baldomero Lamy      Created: Thu Jun 20, 2012  7:17 AM      Regarding: Cpx labs Wed 11/6       Please order  future cpx labs for pt's upcomming lab appt.      Thanks      Rodney Booze

## 2012-06-26 ENCOUNTER — Encounter: Payer: BC Managed Care – PPO | Admitting: Family Medicine

## 2012-06-26 ENCOUNTER — Other Ambulatory Visit (INDEPENDENT_AMBULATORY_CARE_PROVIDER_SITE_OTHER): Payer: BC Managed Care – PPO

## 2012-06-26 DIAGNOSIS — R7309 Other abnormal glucose: Secondary | ICD-10-CM

## 2012-06-26 DIAGNOSIS — R739 Hyperglycemia, unspecified: Secondary | ICD-10-CM

## 2012-06-26 DIAGNOSIS — Z0000 Encounter for general adult medical examination without abnormal findings: Secondary | ICD-10-CM

## 2012-06-26 LAB — CBC WITH DIFFERENTIAL/PLATELET
Basophils Relative: 0.6 % (ref 0.0–3.0)
Eosinophils Relative: 4.5 % (ref 0.0–5.0)
HCT: 43.5 % (ref 36.0–46.0)
Hemoglobin: 14.6 g/dL (ref 12.0–15.0)
Lymphs Abs: 2.7 10*3/uL (ref 0.7–4.0)
MCV: 97.3 fl (ref 78.0–100.0)
Monocytes Absolute: 0.5 10*3/uL (ref 0.1–1.0)
Monocytes Relative: 8.6 % (ref 3.0–12.0)
Neutro Abs: 2.2 10*3/uL (ref 1.4–7.7)
Platelets: 263 10*3/uL (ref 150.0–400.0)
WBC: 5.7 10*3/uL (ref 4.5–10.5)

## 2012-06-26 LAB — LIPID PANEL
Cholesterol: 206 mg/dL — ABNORMAL HIGH (ref 0–200)
Total CHOL/HDL Ratio: 4
Triglycerides: 74 mg/dL (ref 0.0–149.0)
VLDL: 14.8 mg/dL (ref 0.0–40.0)

## 2012-06-26 LAB — COMPREHENSIVE METABOLIC PANEL
Alkaline Phosphatase: 40 U/L (ref 39–117)
BUN: 10 mg/dL (ref 6–23)
CO2: 25 mEq/L (ref 19–32)
Creatinine, Ser: 0.8 mg/dL (ref 0.4–1.2)
GFR: 83.36 mL/min (ref >60.00)
Glucose, Bld: 86 mg/dL (ref 70–99)
Total Bilirubin: 0.6 mg/dL (ref 0.3–1.2)
Total Protein: 7.1 g/dL (ref 6.0–8.3)

## 2012-06-26 LAB — HEMOGLOBIN A1C: Hgb A1c MFr Bld: 5.3 % (ref 4.6–6.5)

## 2012-07-03 ENCOUNTER — Encounter: Payer: Self-pay | Admitting: Family Medicine

## 2012-07-03 ENCOUNTER — Other Ambulatory Visit (HOSPITAL_COMMUNITY)
Admission: RE | Admit: 2012-07-03 | Discharge: 2012-07-03 | Disposition: A | Discharge status: Home / Self Care | Payer: BC Managed Care – PPO | Source: Ambulatory Visit | Attending: Family Medicine | Admitting: Family Medicine

## 2012-07-03 ENCOUNTER — Ambulatory Visit (INDEPENDENT_AMBULATORY_CARE_PROVIDER_SITE_OTHER): Payer: BC Managed Care – PPO | Admitting: Family Medicine

## 2012-07-03 VITALS — BP 114/72 | HR 68 | Temp 98.6°F | Ht 68.0 in | Wt 155.8 lb

## 2012-07-03 DIAGNOSIS — R739 Hyperglycemia, unspecified: Secondary | ICD-10-CM

## 2012-07-03 DIAGNOSIS — R7309 Other abnormal glucose: Secondary | ICD-10-CM

## 2012-07-03 DIAGNOSIS — F909 Attention-deficit hyperactivity disorder, unspecified type: Secondary | ICD-10-CM

## 2012-07-03 DIAGNOSIS — Z01419 Encounter for gynecological examination (general) (routine) without abnormal findings: Secondary | ICD-10-CM | POA: Insufficient documentation

## 2012-07-03 DIAGNOSIS — Z0000 Encounter for general adult medical examination without abnormal findings: Secondary | ICD-10-CM

## 2012-07-03 DIAGNOSIS — Z1151 Encounter for screening for human papillomavirus (HPV): Secondary | ICD-10-CM | POA: Insufficient documentation

## 2012-07-03 MED ORDER — AMPHETAMINE-DEXTROAMPHETAMINE 20 MG PO TABS: ORAL_TABLET | ORAL | Status: DC

## 2012-07-03 MED ORDER — NORETHIN ACE-ETH ESTRAD-FE 1.5-30 MG-MCG PO TABS: 1.0000 | ORAL_TABLET | Freq: Every day | ORAL | Status: DC

## 2012-07-03 MED ORDER — ALPRAZOLAM 0.5 MG PO TABS: ORAL_TABLET | ORAL | Status: DC

## 2012-07-03 NOTE — Patient Instructions (Addendum)
Pap done today Labs ok  Avoid red meat/ fried foods/ egg yolks/ fatty breakfast meats/ butter, cheese and high fat dairy/ and shellfish   Exercise when you can  Try the 4pm adderall dose and let me know if that works out

## 2012-07-03 NOTE — Progress Notes (Signed)
Subjective:    Patient ID: Brenda Harrison, female    DOB: 04-Apr-1976, 36 y.o.   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 If could try a dose later in the day No side effects or trouble sleeping    Hx of hyperglycemia Lab Results  Component Value Date   HGBA1C 5.3 06/26/2012    Lab Results  Component Value Date   CHOL 206* 06/26/2012   HDL 57.80 06/26/2012   LDLCALC 123* 05/17/2011   LDLDIRECT 134.3 06/26/2012   TRIG 74.0 06/26/2012   CHOLHDL 4 06/26/2012   diet just changed - is changing to fish instead of red meat  Has lowered dairy and eggs  Not a lot of shellfish   Patient Active Problem List  Diagnosis  . ANEMIA-NOS  . PANIC DISORDER  . DEPRESSION  . SKIN LESION  . PLANTAR FASCIITIS  . SEIZURE DISORDER  . CERVICAL CANCER, HX OF  . Anxiety  . Ulcer aphthous oral  . Routine general medical examination at a health care facility  . Screening-pulmonary TB  . Drug screening, pre-employment  . ADHD (attention deficit hyperactivity disorder)  . Hyperglycemia  . Routine gynecological examination   Past Medical History  Diagnosis Date  . Anemia   . History of cervical cancer     hx of precancer cells  . Depression     temporary and mild  . Seizure     rxn to  TB skin test  . Bulimia     during her body building years  . HPV (human papilloma virus) infection    Past Surgical History  Procedure Date  . Breast enhancement surgery   . Cervical biopsy  w/ loop electrode excision 2001    to  remove precancerous cells due to HPV 2001   History  Substance Use Topics  . Smoking status: Never Smoker   . Smokeless tobacco: Not on file  . Alcohol Use: Yes     Comment: occasional   Family History  Problem Relation Age of Onset  . Hyperlipidemia Mother   . Hypertension Mother   . Anxiety disorder Mother   . Hyperlipidemia Father   . Hypertension Father   . Depression Father   . Colon cancer Maternal Grandmother   . Lung cancer Maternal Grandmother    Allergies  Allergen Reactions  . Tuberculin Purified Protein Derivative     REACTION: Grand mal seizures if takes TB skin test.   Current Outpatient Prescriptions on File Prior to Visit  Medication Sig Dispense Refill  . ALPRAZolam (XANAX) 0.5 MG tablet TAKE 1 TABLET BY MOUTH EVERY DAY AS NEEDED FOR PANIC ATTACK OR BEFORE A BLOOD DRAW  15 tablet  0  . amphetamine-dextroamphetamine (ADDERALL) 20 MG tablet Take One by Mouth in AM and One at Lunch Time. Fill on or after 06/26/12  60 tablet  0  . calcium carbonate 200 MG capsule Take by mouth daily.        Marland Kitchen ECHINACEA HERB PO Take 1 tablet by mouth daily. During the winter       . Eszopiclone (ESZOPICLONE) 3 MG TABS Take 1 tablet (3 mg total) by mouth at bedtime as needed. Take immediately before bedtime  30 tablet  3  . fluconazole (DIFLUCAN) 150 MG tablet Take 1 tablet (150 mg total) by mouth once.  2 tablet  0  . LYSINE PO Take 1 tablet by mouth daily.        . Multiple Vitamin (MULTIVITAMIN) tablet Take 1 tablet by mouth daily.        . Multiple Vitamins-Minerals (ZINC PO) Take 1 tablet by mouth daily.        . norethindrone-ethinyl estradiol-iron (MICROGESTIN FE,GILDESS FE,LOESTRIN FE) 1.5-30 MG-MCG tablet Take 1 tablet by mouth daily.  1 Package  11  . Omega-3 Fatty Acids (FISH OIL) 1000 MG CAPS Take 1 capsule by mouth daily.        . sertraline (ZOLOFT) 50 MG tablet TAKE 1 TABLET BY MOUTH DAILY  30 tablet  11  . vitamin B-12 (CYANOCOBALAMIN) 50 MCG tablet Take 50 mcg by  mouth daily.             Review of Systems Review of Systems  Constitutional: Negative for fever, appetite change, fatigue and unexpected weight change.  Eyes: Negative for pain and visual disturbance.  Respiratory: Negative for cough and shortness of breath.   Cardiovascular: Negative for cp or palpitations    Gastrointestinal: Negative for nausea, diarrhea and constipation.  Genitourinary: Negative for urgency and frequency.  Skin: Negative for pallor or rash   Neurological: Negative for weakness, light-headedness, numbness and headaches. pos for trouble with attentiveness Hematological: Negative for adenopathy. Does not bruise/bleed easily.  Psychiatric/Behavioral: Negative for dysphoric mood. The patient is not nervous/anxious.         Objective:   Physical Exam  Constitutional: She appears well-developed and well-nourished. No distress.  HENT:  Head: Normocephalic and atraumatic.  Right Ear: External ear normal.  Left Ear: External ear normal.  Nose: Nose normal.  Mouth/Throat: Oropharynx is clear and moist. No oropharyngeal exudate.  Eyes: Conjunctivae normal and EOM are normal. Pupils are equal, round, and reactive to light. Right eye exhibits no discharge. No scleral icterus.  Neck: Normal range of motion. Neck supple. No JVD present. Carotid bruit is not present. No thyromegaly present.  Cardiovascular: Normal rate, regular rhythm and intact distal pulses.  Exam reveals no gallop.   Pulmonary/Chest: Effort normal and breath sounds normal. No respiratory distress. She has no wheezes.  Abdominal: Soft. Bowel sounds are normal. She exhibits no distension, no abdominal bruit and no mass. There is no tenderness.  Genitourinary: Vagina normal and uterus normal. No breast swelling, tenderness, discharge or bleeding. There is no rash, tenderness or lesion on the right labia. There is no rash, tenderness or lesion on the left labia. Uterus is not enlarged and not tender. Cervix  exhibits no motion tenderness, no discharge and no friability. Right adnexum displays no mass, no tenderness and no fullness. Left adnexum displays no mass, no tenderness and no fullness. No bleeding around the vagina. No vaginal discharge found.       Breast exam: No mass, nodules, thickening, tenderness, bulging, retraction, inflamation, nipple discharge or skin changes noted.  No axillary or clavicular LA.  Chaperoned exam.   Implants noted   Musculoskeletal: Normal  range of motion. She exhibits no edema and no tenderness.  Lymphadenopathy:    She has no cervical adenopathy.  Neurological: She is alert. She has normal reflexes. No cranial nerve deficit. She exhibits normal muscle tone. Coordination normal.  Skin: Skin is warm and dry. No rash noted. No erythema. No pallor.  Psychiatric: She has a normal mood and affect.          Assessment & Plan:

## 2012-07-08 ENCOUNTER — Encounter: Payer: Self-pay | Admitting: *Deleted

## 2012-08-02 ENCOUNTER — Other Ambulatory Visit: Payer: Self-pay | Admitting: Family Medicine

## 2012-08-02 MED ORDER — AMPHETAMINE-DEXTROAMPHETAMINE 20 MG PO TABS: ORAL_TABLET | ORAL | Status: DC

## 2012-08-02 NOTE — Telephone Encounter (Signed)
Done and printed for pick up

## 2012-08-02 NOTE — Telephone Encounter (Signed)
Pt advise Rx ready for pick-up 

## 2012-08-05 ENCOUNTER — Other Ambulatory Visit: Payer: Self-pay | Admitting: *Deleted

## 2012-08-05 NOTE — Telephone Encounter (Signed)
Error

## 2012-08-12 ENCOUNTER — Other Ambulatory Visit: Payer: Self-pay | Admitting: Family Medicine

## 2012-08-12 NOTE — Telephone Encounter (Signed)
Px written for call in   

## 2012-08-12 NOTE — Telephone Encounter (Signed)
Ok to refill 

## 2012-08-12 NOTE — Telephone Encounter (Signed)
Rx called in as prescribed and printed Rx destroyed

## 2012-10-18 ENCOUNTER — Telehealth: Payer: Self-pay

## 2012-10-18 NOTE — Telephone Encounter (Signed)
Please schedule visit for xray only (does not need to see me) and let me know what day- I will order it , thanks

## 2012-10-18 NOTE — Telephone Encounter (Signed)
Pt has severe allergy to PPD testing and needs chest xray for school requirements as student at [employer].Please advise.

## 2012-10-21 NOTE — Telephone Encounter (Signed)
Placed reminder in my inbasket to make sure orders are in before pt arrives

## 2012-10-21 NOTE — Telephone Encounter (Signed)
Ok- just please remind me then to order it, thanks

## 2012-10-21 NOTE — Telephone Encounter (Signed)
Terri said pt doesn't need appt just for x-ray she just has to come between 9am-4pm, pt said she will come on 10/31/12 around 9am to get xray

## 2012-10-23 ENCOUNTER — Telehealth: Payer: Self-pay | Admitting: Family Medicine

## 2012-10-23 NOTE — Telephone Encounter (Signed)
Message copied by Judy Pimple on Wed Oct 23, 2012  5:27 PM ------      Message from: Alvina Chou      Created: Wed Oct 23, 2012  4:57 PM      Regarding: x ray order       Please order the chest xray, Thanks, Terri ------

## 2012-10-30 ENCOUNTER — Encounter: Payer: Self-pay | Admitting: *Deleted

## 2012-10-30 ENCOUNTER — Other Ambulatory Visit: Payer: Self-pay | Admitting: Family Medicine

## 2012-10-30 MED ORDER — AMPHETAMINE-DEXTROAMPHETAMINE 20 MG PO TABS: ORAL_TABLET | ORAL | Status: DC

## 2012-11-01 ENCOUNTER — Ambulatory Visit (INDEPENDENT_AMBULATORY_CARE_PROVIDER_SITE_OTHER)
Admission: RE | Admit: 2012-11-01 | Discharge: 2012-11-01 | Disposition: A | Discharge status: Home / Self Care | Payer: BC Managed Care – PPO | Source: Ambulatory Visit | Attending: Family Medicine | Admitting: Family Medicine

## 2012-11-01 DIAGNOSIS — Z111 Encounter for screening for respiratory tuberculosis: Secondary | ICD-10-CM

## 2012-11-07 ENCOUNTER — Encounter: Payer: Self-pay | Admitting: Family Medicine

## 2013-01-16 ENCOUNTER — Encounter: Payer: Self-pay | Admitting: Family Medicine

## 2013-01-16 ENCOUNTER — Other Ambulatory Visit: Payer: Self-pay | Admitting: *Deleted

## 2013-01-16 NOTE — Telephone Encounter (Signed)
Pt sent e-mail request to refill medication, please advise

## 2013-01-17 MED ORDER — NORETHIN ACE-ETH ESTRAD-FE 1.5-30 MG-MCG PO TABS: 1.0000 | ORAL_TABLET | Freq: Every day | ORAL | Status: AC

## 2013-01-17 MED ORDER — SERTRALINE HCL 50 MG PO TABS: ORAL_TABLET | ORAL | Status: AC

## 2013-01-17 NOTE — Telephone Encounter (Signed)
Okay to refill each for 6 months

## 2013-01-17 NOTE — Telephone Encounter (Signed)
done

## 2013-02-10 ENCOUNTER — Other Ambulatory Visit: Payer: Self-pay | Admitting: Family Medicine

## 2013-02-11 NOTE — Telephone Encounter (Signed)
Please let her know I will not be in the office until Monday - and send this back to me, thanks

## 2013-02-16 ENCOUNTER — Other Ambulatory Visit: Payer: Self-pay | Admitting: Family Medicine

## 2013-02-17 ENCOUNTER — Encounter: Payer: Self-pay | Admitting: Family Medicine

## 2013-02-17 ENCOUNTER — Other Ambulatory Visit (INDEPENDENT_AMBULATORY_CARE_PROVIDER_SITE_OTHER): Payer: Self-pay

## 2013-02-17 MED ORDER — AMPHETAMINE-DEXTROAMPHETAMINE 20 MG PO TABS: ORAL_TABLET | ORAL | Status: DC

## 2013-02-17 NOTE — Telephone Encounter (Signed)
Done

## 2013-02-17 NOTE — Telephone Encounter (Signed)
In IN box for pick up

## 2013-02-17 NOTE — Telephone Encounter (Signed)
I sent a mychart message letting pt know Rx's are ready for pick-up but she would have to pick them up not her husband because we are updating our controlled sub agreements

## 2013-02-20 ENCOUNTER — Encounter: Payer: Self-pay | Admitting: Family Medicine

## 2013-03-04 ENCOUNTER — Encounter: Payer: Self-pay | Admitting: Family Medicine

## 2013-04-30 ENCOUNTER — Other Ambulatory Visit: Payer: Self-pay | Admitting: Family Medicine

## 2013-04-30 NOTE — Telephone Encounter (Signed)
Left message requesting pt.to call office.

## 2013-04-30 NOTE — Telephone Encounter (Signed)
Last px should have said - do not fill until August 29th - so she would not need her next until the end of the month I think? Please verify, thanks

## 2013-05-02 NOTE — Telephone Encounter (Signed)
Pt left v/m request cb [phone] may leave detailed message if no answer.

## 2013-05-02 NOTE — Telephone Encounter (Signed)
Pt found the last Rx for this month so she doesn't need this Rx filled, med declined

## 2013-05-13 ENCOUNTER — Other Ambulatory Visit: Payer: Self-pay | Admitting: Family Medicine

## 2013-05-13 NOTE — Telephone Encounter (Signed)
Please ask her what symptoms she is having

## 2013-05-13 NOTE — Telephone Encounter (Signed)
Left voicemail requesting pt to call office, also sent her a mychart message asking what sxs is she having

## 2013-05-14 ENCOUNTER — Other Ambulatory Visit: Payer: Self-pay | Admitting: Family Medicine

## 2013-05-14 MED ORDER — FLUCONAZOLE 150 MG PO TABS: 150.0000 mg | ORAL_TABLET | Freq: Once | ORAL | Status: DC

## 2013-05-14 NOTE — Telephone Encounter (Signed)
Px sent to [facility]- f/u if not improved

## 2013-05-14 NOTE — Telephone Encounter (Signed)
Sent pt mychart message letting her know Rx sent to pharmacy

## 2013-05-14 NOTE — Telephone Encounter (Signed)
See mychart message with pt's sxs

## 2013-06-17 ENCOUNTER — Other Ambulatory Visit: Payer: Self-pay | Admitting: Family Medicine

## 2013-06-17 MED ORDER — AMPHETAMINE-DEXTROAMPHETAMINE 20 MG PO TABS: ORAL_TABLET | ORAL | Status: AC

## 2013-06-17 MED ORDER — AMPHETAMINE-DEXTROAMPHETAMINE 20 MG PO TABS: ORAL_TABLET | ORAL | Status: DC

## 2013-06-17 NOTE — Telephone Encounter (Signed)
Left voicemail and sent pt a mychart mssg letting pt know Rx's ready for pick-up

## 2013-06-17 NOTE — Telephone Encounter (Signed)
Px printed for pick up in IN box  

## 2013-06-23 IMAGING — CR DG CHEST 2V
2 series · 2 of 2 positions shown · non-contrast
Comparison: 05/24/2011

CLINICAL DATA: TB screening.

CHEST - 2 VIEW

[view not recorded (1 of 2)]
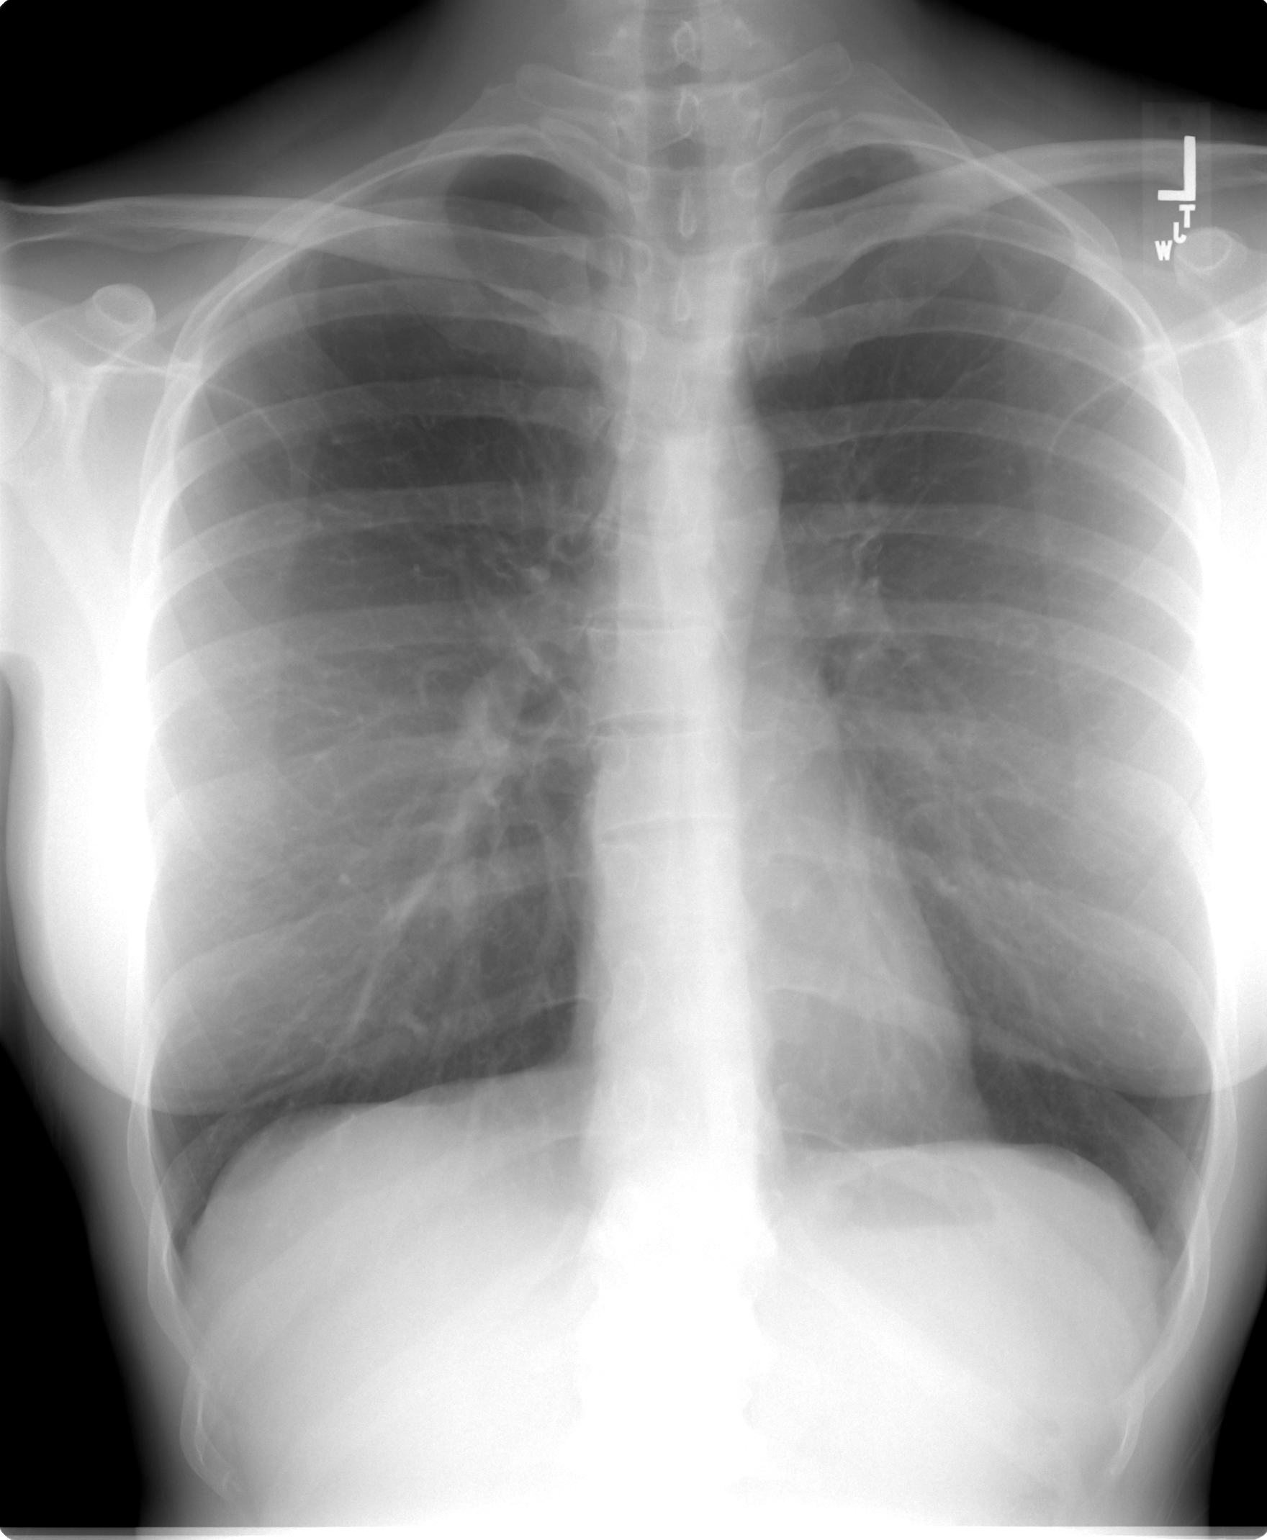

[view not recorded (2 of 2)]
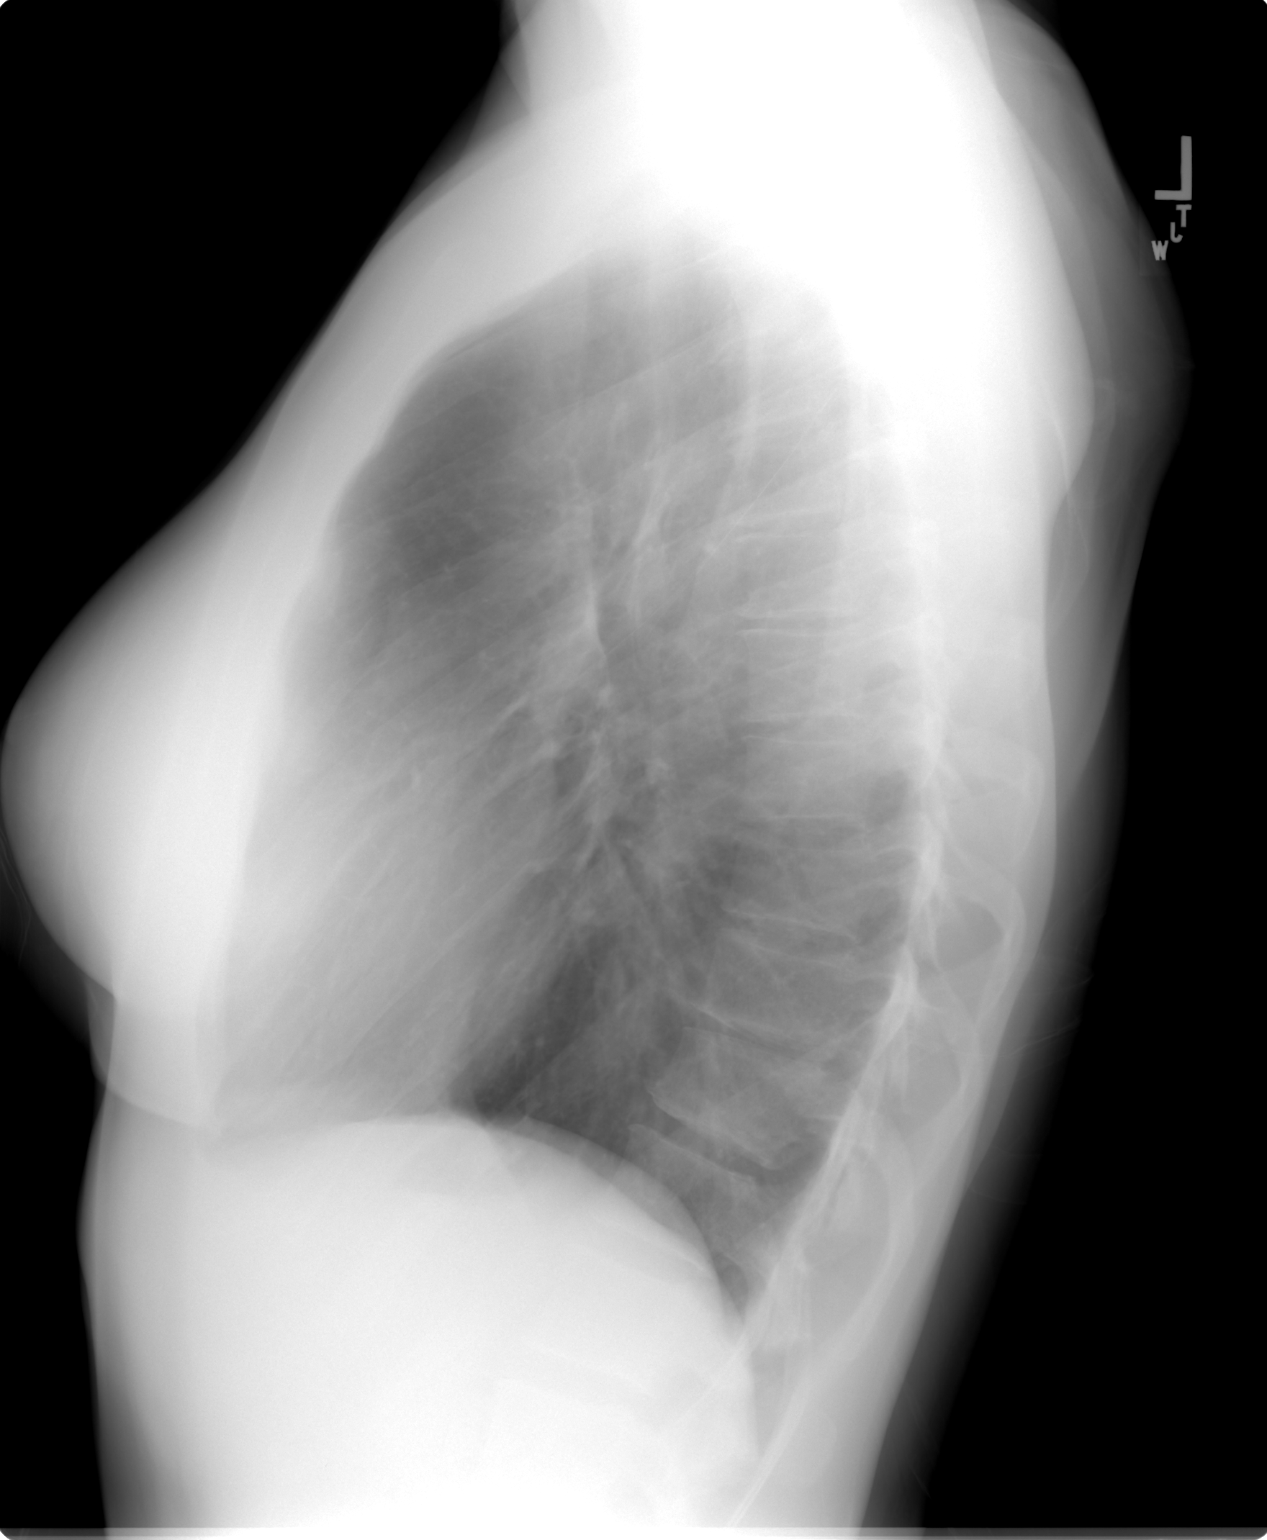

[2 of 2 positions shown; findings below may reference images not displayed]

FINDINGS: Heart size and pulmonary vascularity are normal.  Lungs
are clear.  No significant osseous abnormality.
IMPRESSION: Normal chest, unchanged.

## 2013-06-26 ENCOUNTER — Other Ambulatory Visit: Payer: Self-pay

## 2013-07-21 ENCOUNTER — Encounter: Payer: Self-pay | Admitting: Family Medicine

## 2013-07-21 MED ORDER — NORETHIN ACE-ETH ESTRAD-FE 1.5-30 MG-MCG PO TABS: 1.0000 | ORAL_TABLET | Freq: Every day | ORAL | Status: DC

## 2013-07-21 NOTE — Telephone Encounter (Signed)
Refill of OC 

## 2013-07-30 ENCOUNTER — Encounter: Payer: Self-pay | Admitting: Family Medicine

## 2013-08-05 ENCOUNTER — Telehealth: Payer: Self-pay | Admitting: Family Medicine

## 2013-08-05 NOTE — Telephone Encounter (Signed)
Left voicemail letting pt know letter ready for pick-up 

## 2013-08-05 NOTE — Telephone Encounter (Signed)
Letter in the inbox for pick up - resp to a mychart request

## 2013-08-29 ENCOUNTER — Encounter: Payer: Self-pay | Admitting: Family Medicine

## 2013-08-29 ENCOUNTER — Ambulatory Visit (INDEPENDENT_AMBULATORY_CARE_PROVIDER_SITE_OTHER): Payer: BC Managed Care – PPO | Admitting: Family Medicine

## 2013-08-29 VITALS — BP 122/78 | HR 75 | Temp 98.5°F | Ht 68.0 in | Wt 156.5 lb

## 2013-08-29 DIAGNOSIS — R5383 Other fatigue: Secondary | ICD-10-CM | POA: Insufficient documentation

## 2013-08-29 DIAGNOSIS — R509 Fever, unspecified: Secondary | ICD-10-CM

## 2013-08-29 DIAGNOSIS — G4700 Insomnia, unspecified: Secondary | ICD-10-CM | POA: Insufficient documentation

## 2013-08-29 DIAGNOSIS — R5381 Other malaise: Secondary | ICD-10-CM

## 2013-08-29 LAB — POCT URINALYSIS DIPSTICK
BILIRUBIN UA: NEGATIVE
Glucose, UA: NEGATIVE
Ketones, UA: NEGATIVE
Leukocytes, UA: NEGATIVE
NITRITE UA: NEGATIVE
PH UA: 7.5
Spec Grav, UA: <=1.005
UROBILINOGEN UA: 0.2

## 2013-08-29 LAB — COMPREHENSIVE METABOLIC PANEL
ALK PHOS: 45 U/L (ref 39–117)
ALT: 21 U/L (ref 0–35)
AST: 17 U/L (ref 0–37)
Albumin: 3.8 g/dL (ref 3.5–5.2)
BUN: 9 mg/dL (ref 6–23)
CALCIUM: 8.9 mg/dL (ref 8.4–10.5)
CHLORIDE: 106 meq/L (ref 96–112)
CO2: 25 mEq/L (ref 19–32)
CREATININE: 0.9 mg/dL (ref 0.4–1.2)
GFR: 73.45 mL/min (ref >60.00)
Glucose, Bld: 85 mg/dL (ref 70–99)
Potassium: 3.9 mEq/L (ref 3.5–5.1)
Sodium: 136 mEq/L (ref 135–145)
Total Bilirubin: 0.3 mg/dL (ref 0.3–1.2)
Total Protein: 6.8 g/dL (ref 6.0–8.3)

## 2013-08-29 LAB — CBC WITH DIFFERENTIAL/PLATELET
BASOS ABS: 0 10*3/uL (ref 0.0–0.1)
Basophils Relative: 0.5 % (ref 0.0–3.0)
EOS ABS: 0.2 10*3/uL (ref 0.0–0.7)
Eosinophils Relative: 2.7 % (ref 0.0–5.0)
HEMATOCRIT: 42 % (ref 36.0–46.0)
HEMOGLOBIN: 14.2 g/dL (ref 12.0–15.0)
LYMPHS ABS: 2.8 10*3/uL (ref 0.7–4.0)
Lymphocytes Relative: 38.1 % (ref 12.0–46.0)
MCHC: 33.9 g/dL (ref 30.0–36.0)
MCV: 94.2 fl (ref 78.0–100.0)
Monocytes Absolute: 0.4 10*3/uL (ref 0.1–1.0)
Monocytes Relative: 5.9 % (ref 3.0–12.0)
NEUTROS ABS: 3.9 10*3/uL (ref 1.4–7.7)
Neutrophils Relative %: 52.8 % (ref 43.0–77.0)
Platelets: 298 10*3/uL (ref 150.0–400.0)
RBC: 4.46 Mil/uL (ref 3.87–5.11)
RDW: 12.6 % (ref 11.5–14.6)
WBC: 7.4 10*3/uL (ref 4.5–10.5)

## 2013-08-29 LAB — TSH: TSH: 0.57 u[IU]/mL (ref 0.35–5.50)

## 2013-08-29 LAB — POCT UA - MICROSCOPIC ONLY
BACTERIA, U MICROSCOPIC: 0
Casts, Ur, LPF, POC: 0
Crystals, Ur, HPF, POC: 0
WBC, Ur, HPF, POC: 0
Yeast, UA: 0

## 2013-08-29 MED ORDER — DOXEPIN HCL 6 MG PO TABS: 6.0000 mg | ORAL_TABLET | Freq: Every evening | ORAL | Status: AC | PRN

## 2013-08-29 NOTE — Progress Notes (Signed)
Pre-visit discussion using our clinic review tool. No additional management support is needed unless otherwise documented below in the visit note.  

## 2013-08-29 NOTE — Progress Notes (Signed)
Subjective:    Patient ID: Brenda Harrison, female    DOB: [birth year], 38 y.o.   MRN: [id number]  HPI Here for low grade fever  Since mid December - around 99 (off and on) Nausea - that comes and goes - no heartburn or abd pain (but has had a bit of cough) Some post nasal drip  No facial pain   (occ headaches) , and no purulent nasal drainage  Cannot have a TB skin test  Urine - not drinking as much water/ darker in color- no burning  Feels droopy   Stress - graduated  [employer]  Then job search  Moving to [place] --- sister lives here/ she will visit often Overall a lot of good and bad stressors     Also not sleeping  Her fit bit says she is sleeping about 3 hours  She took one [place] last week- it did not help  She can usually fall asleep- wakes up an hour later and then tosses and turns  She has good sleep hygeine  Goes to bed at 10:00 and does not have a set wake time -- she usually gets up around 5 because she is up  A little less exericse than usual - 3 days per week of running  She never had sleep problems with adderall before   Patient Active Problem List   Diagnosis Date Noted  . Routine gynecological examination 07/03/2012  . Hyperglycemia 10/17/2011  . ADHD (attention deficit hyperactivity disorder) 09/06/2011  . Drug screening, pre-employment 05/16/2011  . Routine general medical examination at a health care facility 05/03/2011  . Screening-pulmonary TB 05/03/2011  . Anxiety 03/01/2011  . Ulcer aphthous oral 03/01/2011  . ANEMIA-NOS 08/04/2010  . PANIC DISORDER 08/04/2010  . DEPRESSION 08/04/2010  . SKIN LESION 08/04/2010  . PLANTAR FASCIITIS 08/04/2010  . SEIZURE DISORDER 08/04/2010  . CERVICAL CANCER, HX OF 08/04/2010   Past Medical History  Diagnosis Date  . Anemia   . History of cervical cancer     hx of precancer cells  . Depression     temporary and mild  . Seizure     rxn to  TB skin test  . Bulimia     during her body building  years  . HPV (human papilloma virus) infection    Past Surgical History  Procedure Laterality Date  . Breast enhancement surgery    . Cervical biopsy  w/ loop electrode excision  2001    to remove precancerous cells due to HPV 2001   History  Substance Use Topics  . Smoking status: Never Smoker   . Smokeless tobacco: Not on file  . Alcohol Use: No   Family History  Problem Relation Age of Onset  . Hyperlipidemia Mother   . Hypertension Mother   . Anxiety disorder Mother   . Hyperlipidemia Father   . Hypertension Father   . Depression Father   . Colon cancer Maternal Grandmother   . Lung cancer Maternal Grandmother    Allergies  Allergen Reactions  . Tuberculin Purified Protein Derivative     REACTION: Grand mal seizures if takes TB skin test.   Current Outpatient Prescriptions on File Prior to Visit  Medication Sig Dispense Refill  . ALPRAZolam (XANAX) 0.5 MG tablet TAKE 1 TABLET BY MOUTH EVERY DAY AS NEEDED FOR PANIC ATTACK OR BEFORE A BLOOD DRAW  15 tablet  0  . amphetamine-dextroamphetamine (ADDERALL) 20 MG tablet Take One by Mouth in AM and [facility], and one at 4pm  90 tablet  0  . calcium carbonate 200 MG capsule Take by mouth daily.        Marland Kitchen. ECHINACEA HERB PO Take 1 tablet by mouth daily. During the winter       . Eszopiclone (ESZOPICLONE) 3 MG TABS Take 1 tablet (3 mg total) by mouth at bedtime as needed. Take immediately before bedtime  30 tablet  3  . LYSINE PO Take 1 tablet by mouth daily.        . Multiple Vitamin (MULTIVITAMIN) tablet Take 1 tablet by mouth daily.        . Multiple Vitamins-Minerals (ZINC PO) Take 1 tablet by mouth daily.        . norethindrone-ethinyl estradiol-iron (MICROGESTIN FE,GILDESS FE,LOESTRIN FE) 1.5-30 MG-MCG tablet Take 1 tablet by mouth daily.  1 Package  5  . sertraline (ZOLOFT) 50 MG tablet TAKE 1 TABLET BY MOUTH DAILY  30 tablet  5  . vitamin B-12 (CYANOCOBALAMIN) 50 MCG tablet Take 50 mcg by mouth daily.         No  current facility-administered medications on file prior to visit.       Review of Systems Review of Systems  Constitutional: Negative for fever, appetite change, fatigue and unexpected weight change.  Eyes: Negative for pain and visual disturbance.  Respiratory: Negative for cough and shortness of breath.   Cardiovascular: Negative for cp or palpitations    Gastrointestinal: Negative for nausea, diarrhea and constipation.  Genitourinary: Negative for urgency and frequency.  Skin: Negative for pallor or rash   Neurological: Negative for weakness, light-headedness, numbness and headaches. pos for insomnia  Hematological: Negative for adenopathy. Does not bruise/bleed easily.  Psychiatric/Behavioral: Negative for dysphoric mood. The patient is nervous/anxious.  pos for stressors and life changes        Objective:   Physical Exam  Constitutional: She appears well-developed and well-nourished. No distress.  HENT:  Head: Normocephalic and atraumatic.  Right Ear: External ear normal.  Left Ear: External ear normal.  Nose: Nose normal.  Mouth/Throat: Oropharynx is clear and moist.  Eyes: Conjunctivae and EOM are normal. Pupils are equal, round, and reactive to light. Right eye exhibits no discharge. Left eye exhibits no discharge. No scleral icterus.  Neck: Normal range of motion. Neck supple. No JVD present. No thyromegaly present.  Cardiovascular: Normal rate, regular rhythm, normal heart sounds and intact distal pulses.  Exam reveals no gallop.   Pulmonary/Chest: Effort normal and breath sounds normal. No respiratory distress. She has no wheezes. She has no rales.  Abdominal: Soft. Bowel sounds are normal. She exhibits no distension and no mass. There is no tenderness.  Musculoskeletal: She exhibits no edema and no tenderness.  Lymphadenopathy:    She has no cervical adenopathy.  Neurological: She is alert. She has normal reflexes. No cranial nerve deficit. She exhibits normal muscle  tone. Coordination normal.  Skin: Skin is warm and dry. No rash noted. No erythema. No pallor.  Psychiatric: She has a normal mood and affect.          Assessment & Plan:

## 2013-08-29 NOTE — Patient Instructions (Signed)
Labs today for fatigue and fever  Try silenor for sleep and let me know if it is helpful Leave a urine specimen on the way out   Insomnia Insomnia is frequent trouble falling and/or staying asleep. Insomnia can be a long term problem or a short term problem. Both are common. Insomnia can be a short term problem when the wakefulness is related to a certain stress or worry. Long term insomnia is often related to ongoing stress during waking hours and/or poor sleeping habits. Overtime, sleep deprivation itself can make the problem worse. Every little thing feels more severe because you are overtired and your ability to cope is decreased. CAUSES   Stress, anxiety, and depression.  Poor sleeping habits.  Distractions such as TV in the bedroom.  Naps close to bedtime.  Engaging in emotionally charged conversations before bed.  Technical reading before sleep.  Alcohol and other sedatives. They may make the problem worse. They can hurt normal sleep patterns and normal dream activity.  Stimulants such as caffeine for several hours prior to bedtime.  Pain syndromes and shortness of breath can cause insomnia.  Exercise late at night.  Changing time zones may cause sleeping problems (jet lag). It is sometimes helpful to have someone observe your sleeping patterns. They should look for periods of not breathing during the night (sleep apnea). They should also look to see how long those periods last. If you live alone or observers are uncertain, you can also be observed at a sleep clinic where your sleep patterns will be professionally monitored. Sleep apnea requires a checkup and treatment. Give your caregivers your medical history. Give your caregivers observations your family has made about your sleep.  SYMPTOMS   Not feeling rested in the morning.  Anxiety and restlessness at bedtime.  Difficulty falling and staying asleep. TREATMENT   Your caregiver may prescribe treatment for an  underlying medical disorders. Your caregiver can give advice or help if you are using alcohol or other drugs for self-medication. Treatment of underlying problems will usually eliminate insomnia problems.  Medications can be prescribed for short time use. They are generally not recommended for lengthy use.  Over-the-counter sleep medicines are not recommended for lengthy use. They can be habit forming.  You can promote easier sleeping by making lifestyle changes such as:  Using relaxation techniques that help with breathing and reduce muscle tension.  Exercising earlier in the day.  Changing your diet and the time of your last meal. No night time snacks.  Establish a regular time to go to bed.  Counseling can help with stressful problems and worry.  Soothing music and white noise may be helpful if there are background noises you cannot remove.  Stop tedious detailed work at least one hour before bedtime. HOME CARE INSTRUCTIONS   Keep a diary. Inform your caregiver about your progress. This includes any medication side effects. See your caregiver regularly. Take note of:  Times when you are asleep.  Times when you are awake during the night.  The quality of your sleep.  How you feel the next day. This information will help your caregiver care for you.  Get out of bed if you are still awake after 15 minutes. Read or do some quiet activity. Keep the lights down. Wait until you feel sleepy and go back to bed.  Keep regular sleeping and waking hours. Avoid naps.  Exercise regularly.  Avoid distractions at bedtime. Distractions include watching television or engaging in any intense or  detailed activity like attempting to balance the household checkbook.  Develop a bedtime ritual. Keep a familiar routine of bathing, brushing your teeth, climbing into bed at the same time each night, listening to soothing music. Routines increase the success of falling to sleep faster.  Use  relaxation techniques. This can be using breathing and muscle tension release routines. It can also include visualizing peaceful scenes. You can also help control troubling or intruding thoughts by keeping your mind occupied with boring or repetitive thoughts like the old concept of counting sheep. You can make it more creative like imagining planting one beautiful flower after another in your backyard garden.  During your day, work to eliminate stress. When this is not possible use some of the previous suggestions to help reduce the anxiety that accompanies stressful situations. MAKE SURE YOU:   Understand these instructions.  Will watch your condition.  Will get help right away if you are not doing well or get worse. Document Released: 08/04/2000 Document Revised: 10/30/2011 Document Reviewed: 09/04/2007 [facility] [employer] Patient Information 2014 [place], [place].

## 2013-08-30 NOTE — Assessment & Plan Note (Signed)
Rev sleep hygeine  Handout given  Has tried multiple meds -incl lunesta -cannot stay asleep Trial of silenor 6 mg daily  Ref way to take this and poss side eff F/u planned

## 2013-08-30 NOTE — Assessment & Plan Note (Signed)
May be due to stressors Lab today Also poor sleep

## 2013-08-30 NOTE — Assessment & Plan Note (Signed)
Unsure cause Lab and ua today  No PPD due to an allergy however

## 2013-09-01 ENCOUNTER — Telehealth: Payer: Self-pay | Admitting: Family Medicine

## 2013-09-01 DIAGNOSIS — R509 Fever, unspecified: Secondary | ICD-10-CM

## 2013-09-01 NOTE — Addendum Note (Signed)
Addended by: Roxy MannsWER, Charlies Rayburn A on: 09/01/2013 04:56 PM   Modules accepted: Orders

## 2013-09-01 NOTE — Telephone Encounter (Signed)
Please put in order for chest x-ray.  Patient is coming tomorrow morning.

## 2013-09-02 ENCOUNTER — Ambulatory Visit (INDEPENDENT_AMBULATORY_CARE_PROVIDER_SITE_OTHER)
Admission: RE | Admit: 2013-09-02 | Discharge: 2013-09-02 | Disposition: A | Discharge status: Home / Self Care | Payer: BC Managed Care – PPO | Source: Ambulatory Visit | Attending: Family Medicine | Admitting: Family Medicine

## 2013-09-02 DIAGNOSIS — R509 Fever, unspecified: Secondary | ICD-10-CM

## 2013-09-04 ENCOUNTER — Encounter: Payer: Self-pay | Admitting: Family Medicine

## 2013-09-12 ENCOUNTER — Encounter: Payer: Self-pay | Admitting: Family Medicine

## 2014-04-24 IMAGING — CR DG CHEST 2V
2 series · 2 of 2 positions shown · non-contrast
Comparison: November 01, 2012

CLINICAL DATA: Fever

EXAM:
CHEST  2 VIEW

[view not recorded (1 of 2)]
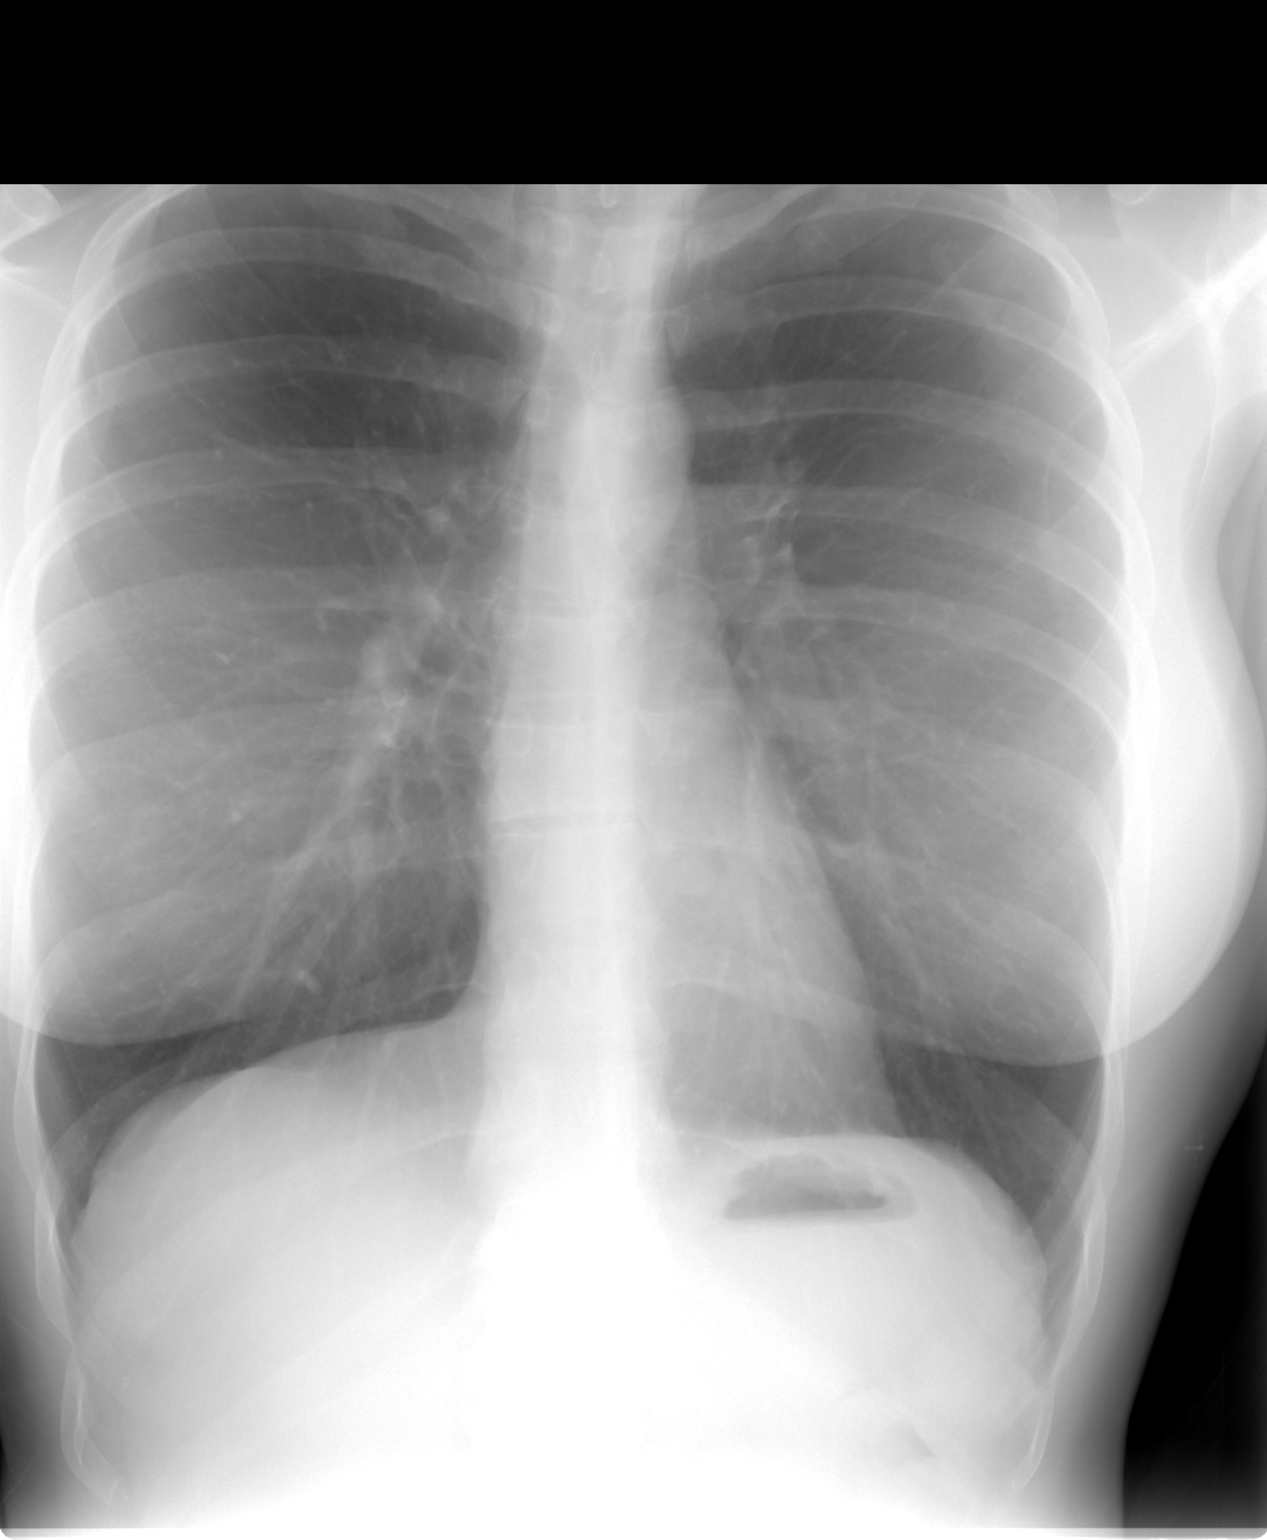

[view not recorded (2 of 2)]
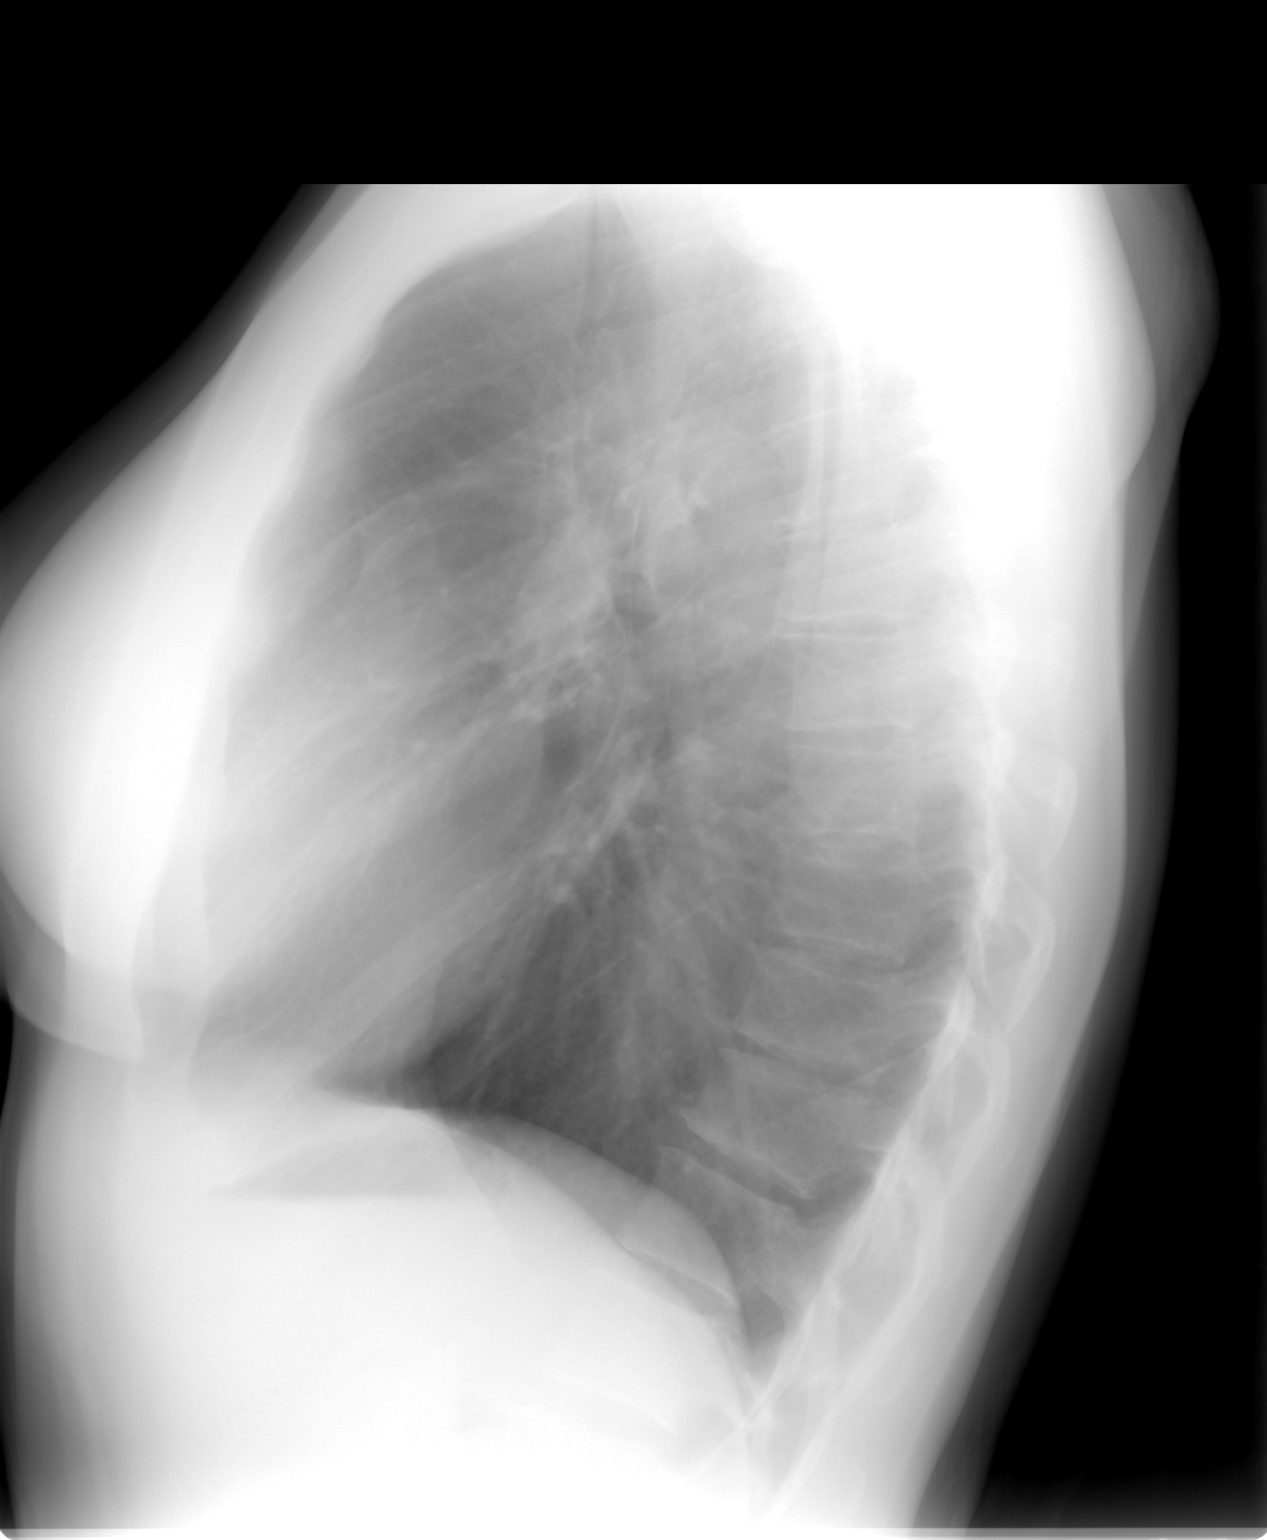

[2 of 2 positions shown; findings below may reference images not displayed]

FINDINGS: The lungs are clear. The heart size and pulmonary vascularity are
normal. No adenopathy. No pneumothorax. There is stable modest
anterior wedging of a lower thoracic vertebral body.
IMPRESSION: No edema or consolidation.
